# Patient Record
Sex: Male | Born: 1940 | Race: White | Hispanic: No | Marital: Married | Smoking: Former smoker
Health system: Southern US, Community
[De-identification: ages and names within clinical notes are randomized; demographics above are authoritative.]

## PROBLEM LIST (undated history)

## (undated) DIAGNOSIS — F32A Depression, unspecified: Secondary | ICD-10-CM

## (undated) DIAGNOSIS — J45909 Unspecified asthma, uncomplicated: Secondary | ICD-10-CM

## (undated) DIAGNOSIS — I10 Essential (primary) hypertension: Secondary | ICD-10-CM

## (undated) DIAGNOSIS — F329 Major depressive disorder, single episode, unspecified: Secondary | ICD-10-CM

## (undated) DIAGNOSIS — E7800 Pure hypercholesterolemia, unspecified: Secondary | ICD-10-CM

## (undated) DIAGNOSIS — F419 Anxiety disorder, unspecified: Secondary | ICD-10-CM

## (undated) DIAGNOSIS — I509 Heart failure, unspecified: Secondary | ICD-10-CM

## (undated) DIAGNOSIS — F410 Panic disorder [episodic paroxysmal anxiety] without agoraphobia: Secondary | ICD-10-CM

## (undated) HISTORY — PX: HAND SURGERY: SHX662

## (undated) HISTORY — PX: PROSTATE SURGERY: SHX751

## (undated) HISTORY — PX: CARDIAC SURGERY: SHX584

---

## 2012-12-07 ENCOUNTER — Emergency Department (HOSPITAL_COMMUNITY): Payer: PRIVATE HEALTH INSURANCE

## 2012-12-07 ENCOUNTER — Emergency Department (HOSPITAL_COMMUNITY)
Admission: EM | Admit: 2012-12-07 | Discharge: 2012-12-07 | Disposition: A | Discharge status: Home / Self Care | Payer: PRIVATE HEALTH INSURANCE | Attending: Emergency Medicine | Admitting: Emergency Medicine

## 2012-12-07 ENCOUNTER — Encounter (HOSPITAL_COMMUNITY): Payer: Self-pay

## 2012-12-07 DIAGNOSIS — F410 Panic disorder [episodic paroxysmal anxiety] without agoraphobia: Secondary | ICD-10-CM | POA: Insufficient documentation

## 2012-12-07 DIAGNOSIS — F3289 Other specified depressive episodes: Secondary | ICD-10-CM | POA: Insufficient documentation

## 2012-12-07 DIAGNOSIS — F411 Generalized anxiety disorder: Secondary | ICD-10-CM | POA: Insufficient documentation

## 2012-12-07 DIAGNOSIS — R45851 Suicidal ideations: Secondary | ICD-10-CM | POA: Insufficient documentation

## 2012-12-07 DIAGNOSIS — R42 Dizziness and giddiness: Secondary | ICD-10-CM | POA: Insufficient documentation

## 2012-12-07 DIAGNOSIS — Z87891 Personal history of nicotine dependence: Secondary | ICD-10-CM | POA: Insufficient documentation

## 2012-12-07 DIAGNOSIS — Z79899 Other long term (current) drug therapy: Secondary | ICD-10-CM | POA: Insufficient documentation

## 2012-12-07 DIAGNOSIS — J45909 Unspecified asthma, uncomplicated: Secondary | ICD-10-CM | POA: Insufficient documentation

## 2012-12-07 DIAGNOSIS — R110 Nausea: Secondary | ICD-10-CM | POA: Insufficient documentation

## 2012-12-07 DIAGNOSIS — R51 Headache: Secondary | ICD-10-CM | POA: Insufficient documentation

## 2012-12-07 DIAGNOSIS — F329 Major depressive disorder, single episode, unspecified: Secondary | ICD-10-CM | POA: Insufficient documentation

## 2012-12-07 DIAGNOSIS — I10 Essential (primary) hypertension: Secondary | ICD-10-CM | POA: Insufficient documentation

## 2012-12-07 DIAGNOSIS — IMO0002 Reserved for concepts with insufficient information to code with codable children: Secondary | ICD-10-CM | POA: Insufficient documentation

## 2012-12-07 DIAGNOSIS — E7800 Pure hypercholesterolemia, unspecified: Secondary | ICD-10-CM | POA: Insufficient documentation

## 2012-12-07 DIAGNOSIS — I509 Heart failure, unspecified: Secondary | ICD-10-CM | POA: Insufficient documentation

## 2012-12-07 DIAGNOSIS — G479 Sleep disorder, unspecified: Secondary | ICD-10-CM | POA: Insufficient documentation

## 2012-12-07 DIAGNOSIS — F419 Anxiety disorder, unspecified: Secondary | ICD-10-CM

## 2012-12-07 DIAGNOSIS — Z7982 Long term (current) use of aspirin: Secondary | ICD-10-CM | POA: Insufficient documentation

## 2012-12-07 HISTORY — DX: Anxiety disorder, unspecified: F41.9

## 2012-12-07 HISTORY — DX: Unspecified asthma, uncomplicated: J45.909

## 2012-12-07 HISTORY — DX: Heart failure, unspecified: I50.9

## 2012-12-07 HISTORY — DX: Essential (primary) hypertension: I10

## 2012-12-07 HISTORY — DX: Depression, unspecified: F32.A

## 2012-12-07 HISTORY — DX: Panic disorder (episodic paroxysmal anxiety): F41.0

## 2012-12-07 HISTORY — DX: Major depressive disorder, single episode, unspecified: F32.9

## 2012-12-07 HISTORY — DX: Pure hypercholesterolemia, unspecified: E78.00

## 2012-12-07 LAB — CBC WITH DIFFERENTIAL/PLATELET
Eosinophils Absolute: 0.1 10*3/uL (ref 0.0–0.7)
Eosinophils Relative: 1 % (ref 0–5)
HCT: 43.1 % (ref 39.0–52.0)
Lymphs Abs: 2.9 10*3/uL (ref 0.7–4.0)
MCH: 29.2 pg (ref 26.0–34.0)
MCV: 84 fL (ref 78.0–100.0)
Monocytes Absolute: 1.1 10*3/uL — ABNORMAL HIGH (ref 0.1–1.0)
Platelets: 338 10*3/uL (ref 150–400)
RBC: 5.13 MIL/uL (ref 4.22–5.81)

## 2012-12-07 LAB — RAPID URINE DRUG SCREEN, HOSP PERFORMED
Amphetamines: NOT DETECTED
Benzodiazepines: NOT DETECTED
Cocaine: NOT DETECTED
Opiates: NOT DETECTED

## 2012-12-07 LAB — COMPREHENSIVE METABOLIC PANEL
BUN: 24 mg/dL — ABNORMAL HIGH (ref 6–23)
CO2: 19 mEq/L (ref 19–32)
Calcium: 9.4 mg/dL (ref 8.4–10.5)
Creatinine, Ser: 1.08 mg/dL (ref 0.50–1.35)
GFR calc Af Amer: 78 mL/min — ABNORMAL LOW (ref >90)
GFR calc non Af Amer: 68 mL/min — ABNORMAL LOW (ref >90)
Glucose, Bld: 149 mg/dL — ABNORMAL HIGH (ref 70–99)
Sodium: 135 mEq/L (ref 135–145)
Total Protein: 7.7 g/dL (ref 6.0–8.3)

## 2012-12-07 MED ORDER — ONDANSETRON 8 MG PO TBDP
8.0000 mg | ORAL_TABLET | Freq: Once | ORAL | Status: AC
  Administered 2012-12-07: 8 mg via ORAL
  Filled 2012-12-07: qty 1

## 2012-12-07 MED ORDER — LORAZEPAM 1 MG PO TABS: 1.0000 mg | ORAL_TABLET | Freq: Three times a day (TID) | ORAL | Status: AC | PRN

## 2012-12-07 NOTE — ED Provider Notes (Signed)
History     CSN: [id number]  Arrival date & time 12/07/12  1353   First MD Initiated Contact with Patient 12/07/12 1507      Chief Complaint  Patient presents with  . Medical Clearance  . Suicidal  . Agitation  . Headache    (Consider location/radiation/quality/duration/timing/severity/associated sxs/prior treatment) Patient is a 71 y.o. male presenting with headaches. The history is provided by the patient.  Headache   He was started on an pressure and new anxiety medication about one week ago. Since then, he has had intermittent problems with headache in the vertex, intermittent dizziness, intermittent nausea. He has been somewhat more anxious. This morning was the worst episode of anxiety. He told his family that he didn't want to live because he felt so bad he denies actual suicidal thoughts. He denies any visual disturbance. He has had some intermittent crying spells and does have chronic sleep disturbance. He denies anhedonia and denies hallucinations. Family relates that he has chronic problems with anxiety. Review of his record shows that the new blood pressure medicine was lisinopril, and the new anxiety medication was sertraline.  Past Medical History  Diagnosis Date  . Hypertension   . Depression   . Anxiety   . Panic attack   . CHF (congestive heart failure)   . Asthma   . High cholesterol     Past Surgical History  Procedure Date  . Cardiac surgery   . Hand surgery   . Prostate surgery     No family history on file.  History  Substance Use Topics  . Smoking status: Former Games developer  . Smokeless tobacco: Never Used  . Alcohol Use: No      Review of Systems  Neurological: Positive for headaches.  All other systems reviewed and are negative.    Allergies  Contrast media  Home Medications   Current Outpatient Rx  Name  Route  Sig  Dispense  Refill  . ALBUTEROL SULFATE HFA 108 (90 BASE) MCG/ACT IN AERS   Inhalation   Inhale 2 puffs into the  lungs every 6 (six) hours as needed. Shortness of breath         . ASPIRIN 325 MG PO TABS   Oral   Take 650 mg by mouth once.         . ASPIRIN EC 81 MG PO TBEC   Oral   Take 81 mg by mouth daily.         . ATORVASTATIN CALCIUM 10 MG PO TABS   Oral   Take 10 mg by mouth at bedtime.         Marland Kitchen CARVEDILOL 12.5 MG PO TABS   Oral   Take 12.5 mg by mouth 2 (two) times daily with a meal.         . HYDRALAZINE HCL 50 MG PO TABS   Oral   Take 50 mg by mouth 3 (three) times daily.         Marland Kitchen LISINOPRIL 10 MG PO TABS   Oral   Take 10 mg by mouth daily.         Marland Kitchen MONTELUKAST SODIUM 10 MG PO TABS   Oral   Take 10 mg by mouth at bedtime.         Marland Kitchen NIACIN ER (ANTIHYPERLIPIDEMIC) 500 MG PO TBCR   Oral   Take 500 mg by mouth at bedtime.         Marland Kitchen RANITIDINE HCL 150 MG PO TABS  Oral   Take 150 mg by mouth 2 (two) times daily.         . SERTRALINE HCL 50 MG PO TABS   Oral   Take 50 mg by mouth daily.           BP 188/89  Pulse 86  Temp 98.4 F (36.9 C) (Oral)  Resp 18  SpO2 96%  Physical Exam  Nursing note and vitals reviewed. 71 year old male, resting comfortably and in no acute distress. Vital signs are significant for hypertension with blood pressure 188/89. Oxygen saturation is 96%, which is normal. Head is normocephalic and atraumatic. PERRLA, EOMI. Oropharynx is clear. Fundi show no hemorrhage, exudate, or papilledema. Neck is nontender and supple without adenopathy or JVD. No carotid bruits. Back is nontender and there is no CVA tenderness. Lungs are clear without rales, wheezes, or rhonchi. Chest is nontender. Heart has regular rate and rhythm without murmur. Abdomen is soft, flat, nontender without masses or hepatosplenomegaly and peristalsis is normoactive. Extremities have no cyanosis or edema, full range of motion is present. Skin is warm and dry without rash. Neurologic: Mental status is normal, cranial nerves are intact, there are no  motor or sensory deficits. Dizziness is not reproduced by head movement.   ED Course  Procedures (including critical care time)  Results for orders placed during the hospital encounter of 12/07/12  CBC WITH DIFFERENTIAL      Component Value Range   WBC 11.1 (*) 4.0 - 10.5 K/uL   RBC 5.13  4.22 - 5.81 MIL/uL   Hemoglobin 15.0  13.0 - 17.0 g/dL   HCT 40.9  81.1 - 91.4 %   MCV 84.0  78.0 - 100.0 fL   MCH 29.2  26.0 - 34.0 pg   MCHC 34.8  30.0 - 36.0 g/dL   RDW 78.2  95.6 - 21.3 %   Platelets 338  150 - 400 K/uL   Neutrophils Relative 63  43 - 77 %   Neutro Abs 7.0  1.7 - 7.7 K/uL   Lymphocytes Relative 26  12 - 46 %   Lymphs Abs 2.9  0.7 - 4.0 K/uL   Monocytes Relative 10  3 - 12 %   Monocytes Absolute 1.1 (*) 0.1 - 1.0 K/uL   Eosinophils Relative 1  0 - 5 %   Eosinophils Absolute 0.1  0.0 - 0.7 K/uL   Basophils Relative 1  0 - 1 %   Basophils Absolute 0.1  0.0 - 0.1 K/uL  COMPREHENSIVE METABOLIC PANEL      Component Value Range   Sodium 135  135 - 145 mEq/L   Potassium 3.8  3.5 - 5.1 mEq/L   Chloride 104  96 - 112 mEq/L   CO2 19  19 - 32 mEq/L   Glucose, Bld 149 (*) 70 - 99 mg/dL   BUN 24 (*) 6 - 23 mg/dL   Creatinine, Ser 0.86  0.50 - 1.35 mg/dL   Calcium 9.4  8.4 - 57.8 mg/dL   Total Protein 7.7  6.0 - 8.3 g/dL   Albumin 4.1  3.5 - 5.2 g/dL   AST 18  0 - 37 U/L   ALT 20  0 - 53 U/L   Alkaline Phosphatase 62  39 - 117 U/L   Total Bilirubin 0.5  0.3 - 1.2 mg/dL   GFR calc non Af Amer 68 (*) >90 mL/min   GFR calc Af Amer 78 (*) >90 mL/min  URINE RAPID DRUG SCREEN (HOSP  PERFORMED)      Component Value Range   Opiates NONE DETECTED  NONE DETECTED   Cocaine NONE DETECTED  NONE DETECTED   Benzodiazepines NONE DETECTED  NONE DETECTED   Amphetamines NONE DETECTED  NONE DETECTED   Tetrahydrocannabinol NONE DETECTED  NONE DETECTED   Barbiturates NONE DETECTED  NONE DETECTED  ETHANOL      Component Value Range   Alcohol, Ethyl (B) <11  0 - 11 mg/dL   Ct Head Wo  Contrast  12/07/2012  *RADIOLOGY REPORT*  Clinical Data: 71 year old male who is agitated.  Headache.  CT HEAD WITHOUT CONTRAST  Technique:  Contiguous axial images were obtained from the base of the skull through the vertex without contrast.  Comparison: None.  Findings: Left maxillary sinus mucosal thickening, bubbly opacity. Minor ethmoid air cell mucosal thickening.  Other Visualized paranasal sinuses and mastoids are clear.  No acute osseous abnormality identified.  Visualized orbits and scalp soft tissues are within normal limits.  Mild Calcified atherosclerosis at the skull base.  Mild for age scattered cerebral white matter hypodensity. Cerebral volume is within normal limits for age.  No ventriculomegaly. No midline shift, mass effect, or evidence of mass lesion.  No acute intracranial hemorrhage identified.  No evidence of cortically based acute infarction identified.  No suspicious intracranial vascular hyperdensity.  IMPRESSION: 1. No acute intracranial abnormality.  Mild for age nonspecific white matter changes. 2.  Mild left maxillary sinusitis.   Original Report Authenticated By: Erskine Speed, M.D.       1. Anxiety   2. Hypertension       MDM  Worsening of anxiety which may be medication related. He does not have a condition which would require acute psychiatric intervention. Head CT will be obtained and I anticipate taking him off of the sertraline and treating his anxiety with lorazepam as necessary.        Dione Booze, MD 12/08/12 [phone]

## 2012-12-07 NOTE — ED Notes (Addendum)
Patient was started on new psych medications and blood pressure medicine a week ago and has been feeling agitated, having a headache, and having suicidal thoughts.  Patient denies having a plan for SI/HI  Patient's daughter reports that the patient told his PCP that he was depressed and had anxiety. Patient at times denies SI , but Dr. Shary Decamp reported that he was hugging his family good-bye at the office.

## 2013-06-11 IMAGING — CT CT HEAD W/O CM
2 series · 15 of 30 positions shown, 19 images · non-contrast
Comparison: None.

CLINICAL DATA: 71-year-old male who is agitated.  Headache.

CT HEAD WITHOUT CONTRAST
TECHNIQUE: Contiguous axial images were obtained from the base of
the skull through the vertex without contrast.

[Series 2: head w/o · axial · non-contrast · 0.43mm/px · z∈[+1416,+1536]mm · 13 of 30 slices shown, 17 images]
[im 3/30  brain]
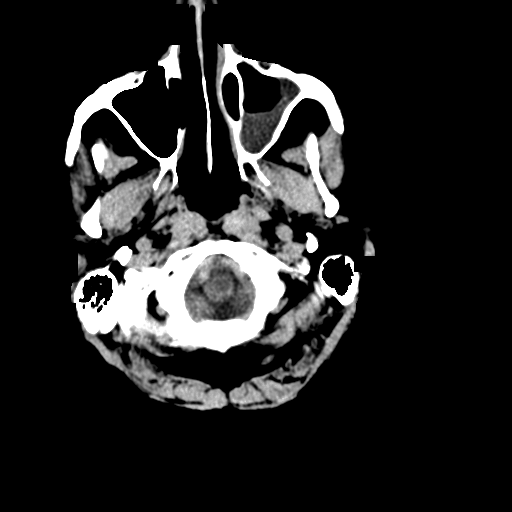
[im 3/30  bone]
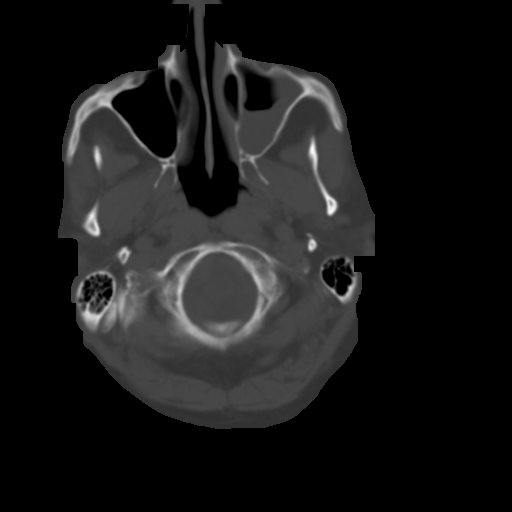
[im 5/30  brain]
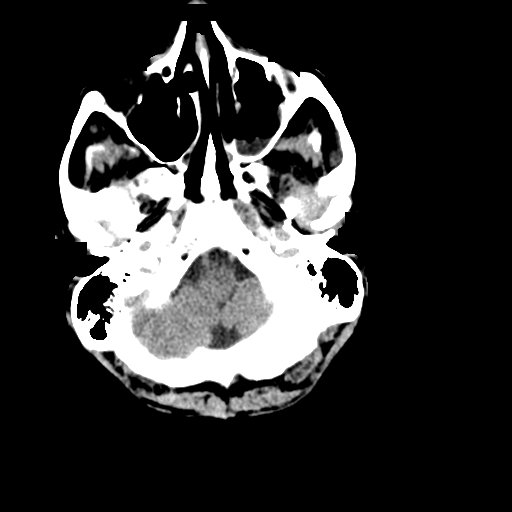
[im 7/30  brain]
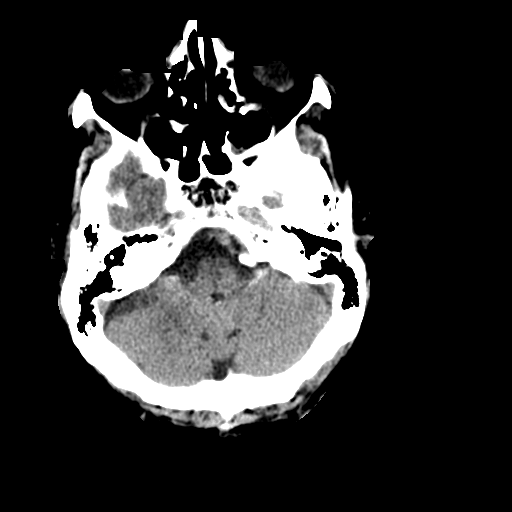
[im 9/30  brain]
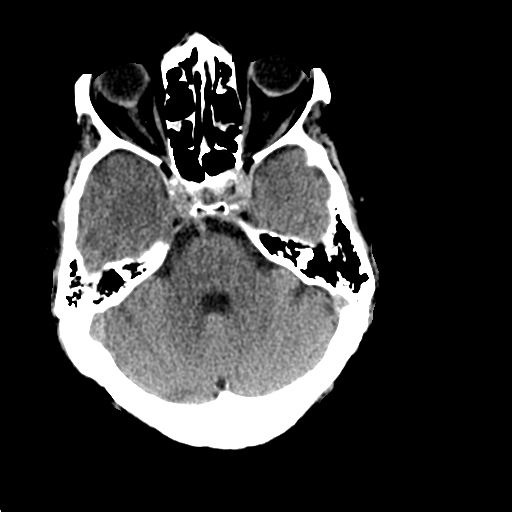
[im 11/30  brain]
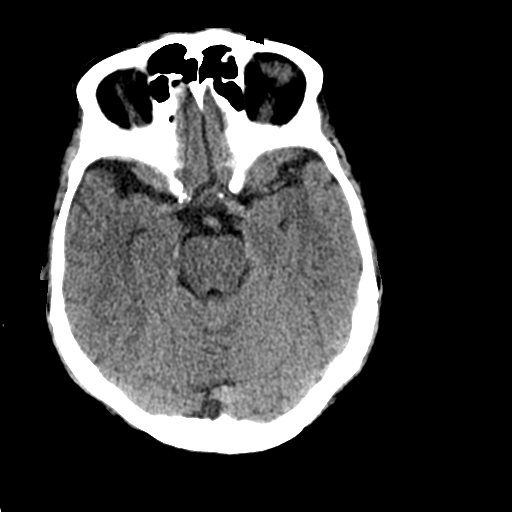
[im 11/30  bone]
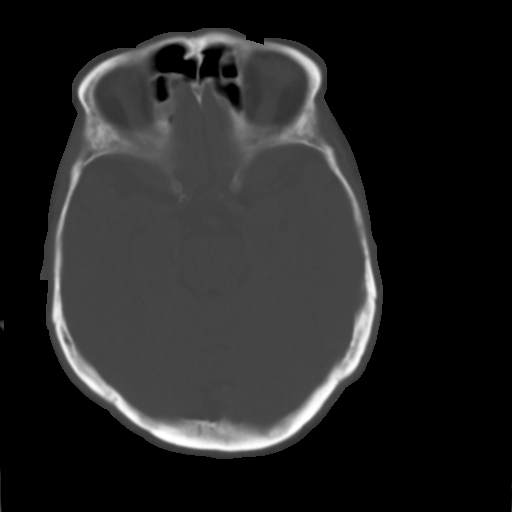
[im 13/30  brain]
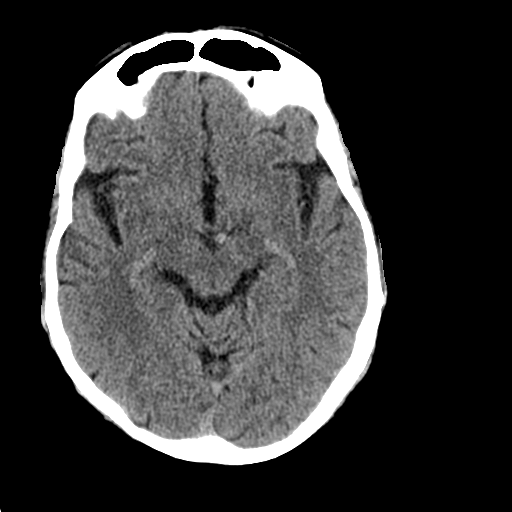
[im 15/30  brain]
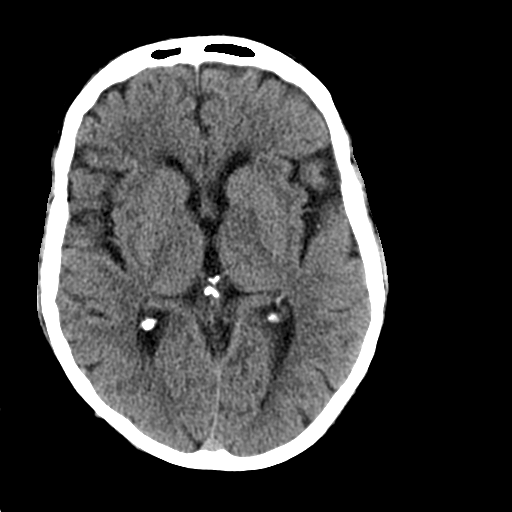
[im 17/30  brain]
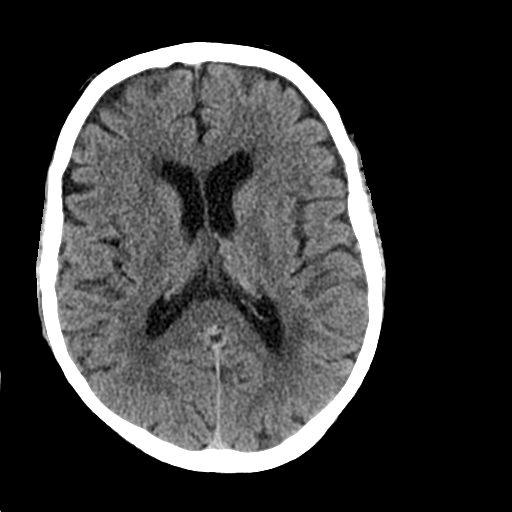
[im 19/30  brain]
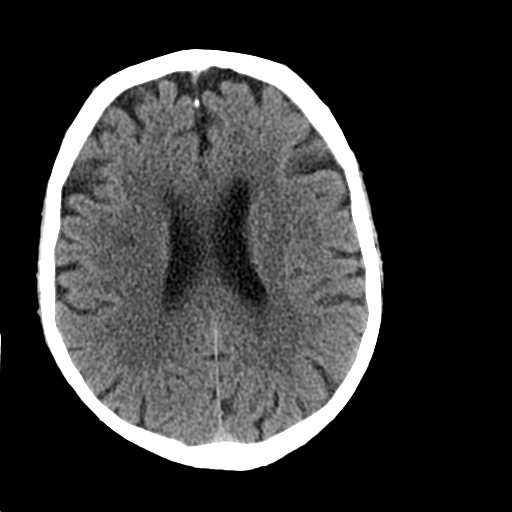
[im 19/30  bone]
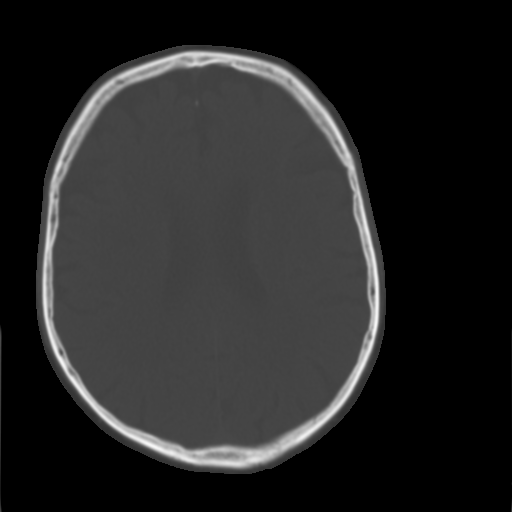
[im 21/30  brain]
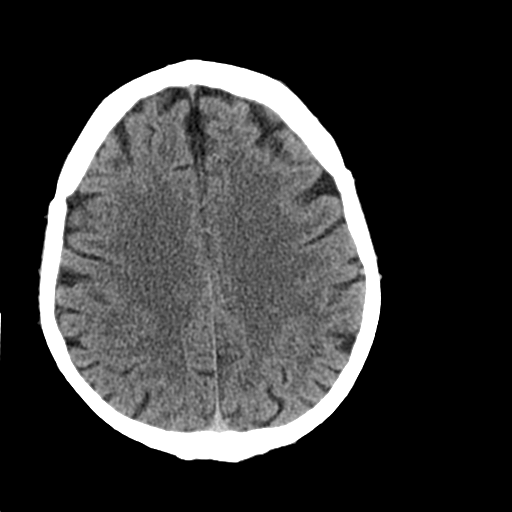
[im 23/30  brain]
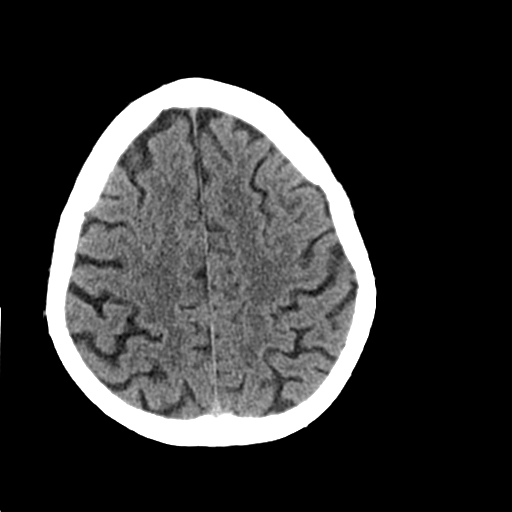
[im 25/30  brain]
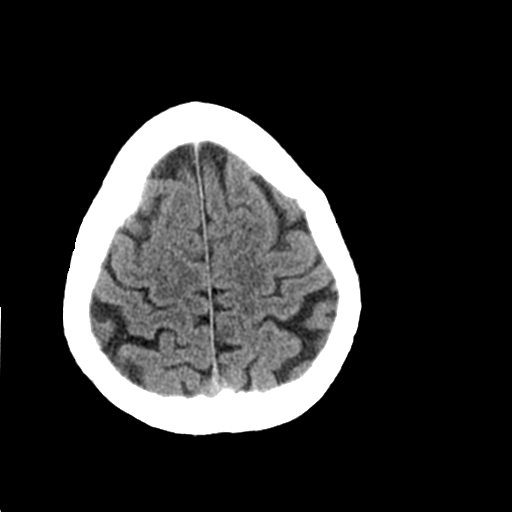
[im 27/30  brain]
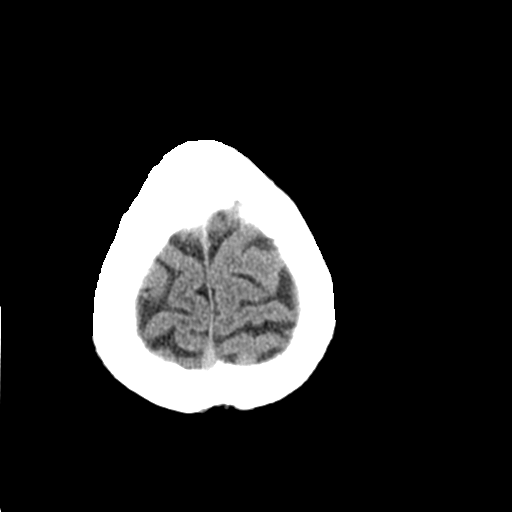
[im 27/30  bone]
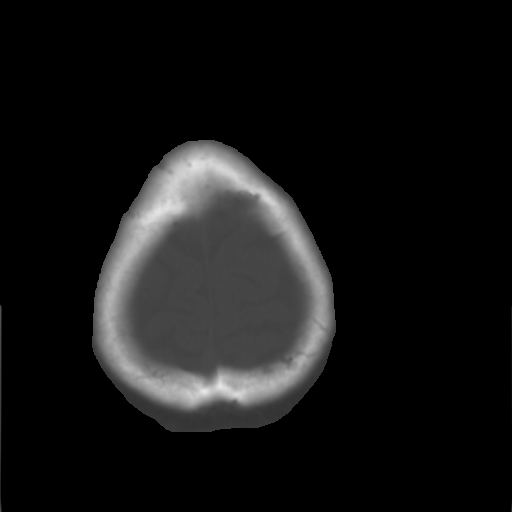

[Series 3: bone windows · axial · 0.43mm/px · z∈[+1416,+1436]mm · 2 of 30 slices shown]
[im 3/30  bone]
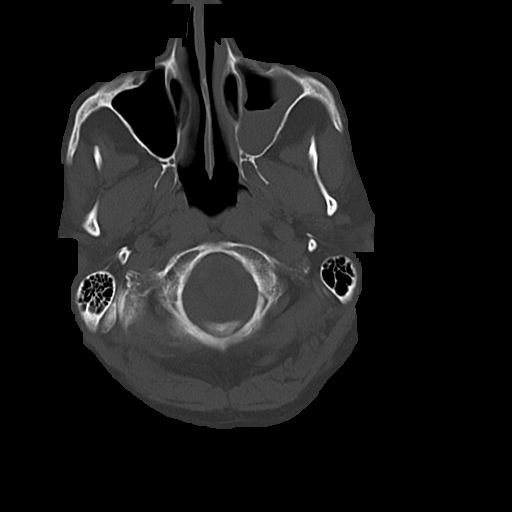
[im 7/30  bone]
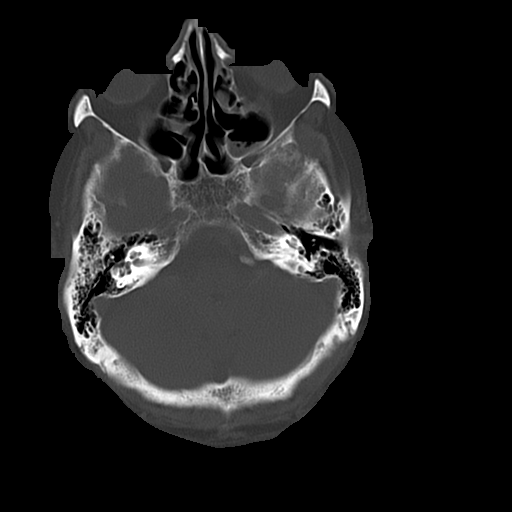

[15 of 30 positions shown; findings below may reference images not displayed]

FINDINGS: Left maxillary sinus mucosal thickening, bubbly opacity.
Minor ethmoid air cell mucosal thickening.  Other Visualized
paranasal sinuses and mastoids are clear.  No acute osseous
abnormality identified.  Visualized orbits and scalp soft tissues
are within normal limits.  Mild Calcified atherosclerosis at the
skull base.

Mild for age scattered cerebral white matter hypodensity. Cerebral
volume is within normal limits for age.  No ventriculomegaly. No
midline shift, mass effect, or evidence of mass lesion.  No acute
intracranial hemorrhage identified.  No evidence of cortically
based acute infarction identified.  No suspicious intracranial
vascular hyperdensity.
IMPRESSION: 1. No acute intracranial abnormality.  Mild for age nonspecific
white matter changes.
2.  Mild left maxillary sinusitis.

## 2015-06-27 ENCOUNTER — Other Ambulatory Visit: Payer: Self-pay | Admitting: *Deleted

## 2015-06-27 DIAGNOSIS — I739 Peripheral vascular disease, unspecified: Secondary | ICD-10-CM

## 2015-07-26 ENCOUNTER — Encounter (HOSPITAL_COMMUNITY): Payer: PRIVATE HEALTH INSURANCE

## 2015-07-26 ENCOUNTER — Encounter: Payer: PRIVATE HEALTH INSURANCE | Admitting: Vascular Surgery

## 2015-08-21 ENCOUNTER — Telehealth: Payer: Self-pay | Admitting: Vascular Surgery

## 2015-08-21 NOTE — Telephone Encounter (Signed)
-----   Message from Phillips Odor, RN sent at 08/21/2015  2:43 PM EDT ----- Regarding: needs appt. rescheduled from 9/8 to future Contact: [phone] Pt's daughter, Tobi Bastos, called to report pt. 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  The daughter stated he may not tolerate the ABI due to his pain and sensitivity, in the left ankle.  Please call her to reschedule his appts. for ABI and Dr. Marty Heck; contact Tobi Bastos @ [phone].  She is his POA.  She stated he is very forgetful.  Thanks.

## 2015-08-21 NOTE — Telephone Encounter (Signed)
Spoke with Tobi Bastos to r.s to 09/13/2015, dpm

## 2015-08-23 ENCOUNTER — Encounter: Payer: PRIVATE HEALTH INSURANCE | Admitting: Vascular Surgery

## 2015-08-23 ENCOUNTER — Encounter (HOSPITAL_COMMUNITY): Payer: PRIVATE HEALTH INSURANCE

## 2015-09-11 ENCOUNTER — Encounter: Payer: Self-pay | Admitting: Vascular Surgery

## 2015-09-13 ENCOUNTER — Ambulatory Visit (HOSPITAL_COMMUNITY)
Admission: RE | Admit: 2015-09-13 | Discharge: 2015-09-13 | Disposition: A | Discharge status: Home / Self Care | Payer: Medicare Other | Source: Ambulatory Visit | Attending: Vascular Surgery | Admitting: Vascular Surgery

## 2015-09-13 ENCOUNTER — Ambulatory Visit (INDEPENDENT_AMBULATORY_CARE_PROVIDER_SITE_OTHER): Payer: Medicare Other | Admitting: Vascular Surgery

## 2015-09-13 ENCOUNTER — Encounter: Payer: Self-pay | Admitting: Vascular Surgery

## 2015-09-13 VITALS — BP 149/84 | HR 52 | Temp 97.9°F | Resp 14 | Ht 66.0 in | Wt 195.0 lb

## 2015-09-13 DIAGNOSIS — I739 Peripheral vascular disease, unspecified: Secondary | ICD-10-CM

## 2015-09-13 DIAGNOSIS — I10 Essential (primary) hypertension: Secondary | ICD-10-CM | POA: Insufficient documentation

## 2015-09-13 DIAGNOSIS — E780 Pure hypercholesterolemia: Secondary | ICD-10-CM | POA: Insufficient documentation

## 2015-09-13 NOTE — Progress Notes (Signed)
[facility] [place] HISTORY AND PHYSICAL   History of Present Illness:  Patient is a 74 y.o. year old male who presents for evaluation of bilateral leg pain.  He develops pain in both legs after walking approximately one block. This is been slowly progressive over the last 6 months to one year. He states that if he stops walking the pain is relieved after 4-5 minutes. He did recently have some type of I&D of a left ankle infection in [facility] approximate 3-4 weeks ago. He states the ankle is still sore but is improving. He denies rest pain in the feet. He states he did have an arterial study in his leg several years ago which was normal. However, he states that it was not completely normal and there was suspicion that he might have small vessel disease in his feet. He denies any nonhealing wounds. Other medical problems include hypertension, coronary disease, elevated cholesterol, asthma, congestive heart failure. All of which are currently stable. He is on aspirin and ACE inhibitor and a statin. 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  Past Medical History  Diagnosis Date  . Hypertension   . Depression   . Anxiety   . Panic attack   . CHF (congestive heart failure)   . Asthma   . High cholesterol     Past Surgical History  Procedure Laterality Date  . Cardiac surgery    . Hand surgery    . Prostate surgery      Social History Social History  Substance Use Topics  . Smoking status: Former Games developer  . Smokeless tobacco: Never Used  . Alcohol Use: No    Family History No family history on file.  Allergies  Allergies  Allergen Reactions  . Contrast Media [Iodinated Diagnostic Agents] Other (See Comments)    Acute kidney failure      Current Outpatient Prescriptions  Medication Sig Dispense Refill  . ALPRAZolam (XANAX) 1 MG tablet Take 1  mg by mouth at bedtime as needed for anxiety.    Marland Kitchen aspirin EC 81 MG tablet Take 81 mg by mouth daily.    Marland Kitchen atorvastatin (LIPITOR) 10 MG tablet Take 10 mg by mouth at bedtime.    . carvedilol (COREG) 12.5 MG tablet Take 25 mg by mouth 2 (two) times daily with a meal.     . hydrALAZINE (APRESOLINE) 50 MG tablet Take 50 mg by mouth 2 (two) times daily.     . hydrochlorothiazide (MICROZIDE) 12.5 MG capsule Take 12.5 mg by mouth daily.    . ranitidine (ZANTAC) 150 MG tablet Take 150 mg by mouth 2 (two) times daily.    Marland Kitchen albuterol (PROVENTIL HFA;VENTOLIN HFA) 108 (90 BASE) MCG/ACT inhaler Inhale 2 puffs into the lungs every 6 (six) hours as needed. Shortness of breath    . lisinopril (PRINIVIL,ZESTRIL) 10 MG tablet Take 10 mg by mouth daily.    Marland Kitchen LORazepam (ATIVAN) 1 MG tablet Take 1 tablet (1 mg total) by mouth 3 (three) times daily as needed for anxiety. (Patient not taking: Reported on 09/13/2015) 30 tablet 0  . montelukast (SINGULAIR) 10 MG tablet Take 10 mg by mouth at bedtime.    . niacin (NIASPAN) 500 MG CR tablet Take 500 mg by mouth at bedtime.    . [DISCONTINUED] sertraline (ZOLOFT) 50 MG tablet Take 50 mg by mouth daily.     No  current facility-administered medications for this visit.    ROS:   General:  No weight loss, Fever, chills  HEENT: No recent headaches, no nasal bleeding, no visual changes, no sore throat  Neurologic: No dizziness, blackouts, seizures. No recent symptoms of stroke or mini- stroke. No recent episodes of slurred speech, or temporary blindness.  Cardiac: No recent episodes of chest pain/pressure, no shortness of breath at rest.  + shortness of breath with exertion.  Denies history of atrial fibrillation or irregular heartbeat  Vascular: No history of rest pain in feet.  No history of claudication.  No history of non-healing ulcer, No history of DVT   Pulmonary: No home oxygen, no productive cough, no hemoptysis,  No asthma or wheezing  Musculoskeletal:    Arthritis,  Low back pain,  [x ] Joint pain  Hematologic:No history of hypercoagulable state.  No history of easy bleeding.  No history of anemia  Gastrointestinal: No hematochezia or melena,  No gastroesophageal reflux, no trouble swallowing  Urinary: [x ] chronic Kidney disease,  on HD -  MWF or  TTHS,  Burning with urination,  Frequent urination,  Difficulty urinating;   Skin: No rashes  Psychological: + history of anxiety,  + history of depression   Physical Examination  Filed Vitals:   09/13/15 0957 09/13/15 1000  BP: 156/89 149/84  Pulse: 56 52  Temp: 97.9 F (36.6 C)   TempSrc: Oral   Resp: 14   Height:  (1.676 m)   Weight: 195 lb (88.451 kg)   SpO2: 99%     Body mass index is 31.49 kg/(m^2).  General:  Alert and oriented, no acute distress HEENT: Normal Neck: No bruit or JVD Pulmonary: Clear to auscultation bilaterally Cardiac: Regular Rate and Rhythm without murmur Abdomen: Soft, non-tender, non-distended, no mass, slightly obese Skin: No rash Extremity Pulses:  2+ radial, brachial, 2+ right femoral, 2+ right dorsalis pedis, absent left femoral popliteal and pedal pulses, feet are pink and warm bilaterally Musculoskeletal: No deformity or edema  Neurologic: Upper and lower extremity motor 5/5 and symmetric  DATA:  Patient had bilateral ABIs performed today which were 1.0 on the right 0.9 on the left triphasic waveforms on the right biphasic on the left   ASSESSMENT:  Evidence of peripheral arterial disease in the left lower extremity; however, the patient has normal ABIs and a palpable pulse on the right side. His left leg symptoms probably are attributable to a component of peripheral arterial disease. However this does not really explain his right leg symptoms where he has a completely normal arterial exam. The patient is not a very good candidate for an arteriogram due to previous problems with renal failure after contrast exposure. He  is currently not at risk of limb loss and has adequate perfusion for wound healing in his left lower extremity. I believe the best option at this point would be a conservative walking program of 30 minutes of walking daily to try to improve his walking distance. I discussed with the patient and his daughter at length today risk versus benefit of an intervention for his arterial disease versus benefit. If the patient continues to have worsening of symptoms or is more bothered by this or has no improvement with his walking program we could revisit whether or not to do an arteriogram although this would carry some risk.   PLAN:  The patient will have follow-up ABIs and arterial duplex scan in 6  months. He will walk 30 minutes daily.  Fabienne Bruns, MD [facility] [place] Office: [phone] Pager: [phone]

## 2015-09-13 NOTE — Addendum Note (Signed)
Addended by: Adria Dill L on: 09/13/2015 11:18 AM   Modules accepted: Orders

## 2016-03-13 ENCOUNTER — Encounter (HOSPITAL_COMMUNITY): Payer: Medicare Other

## 2016-03-13 ENCOUNTER — Ambulatory Visit: Payer: Medicare Other | Admitting: Family

## 2016-03-20 ENCOUNTER — Ambulatory Visit: Payer: Medicare Other | Admitting: Family

## 2016-03-20 ENCOUNTER — Encounter (HOSPITAL_COMMUNITY): Payer: Medicare Other

## 2016-03-24 DIAGNOSIS — E119 Type 2 diabetes mellitus without complications: Secondary | ICD-10-CM | POA: Diagnosis not present

## 2016-03-24 DIAGNOSIS — I10 Essential (primary) hypertension: Secondary | ICD-10-CM | POA: Diagnosis not present

## 2016-03-24 DIAGNOSIS — J209 Acute bronchitis, unspecified: Secondary | ICD-10-CM | POA: Diagnosis not present

## 2016-04-07 DIAGNOSIS — M179 Osteoarthritis of knee, unspecified: Secondary | ICD-10-CM | POA: Diagnosis not present

## 2016-04-07 DIAGNOSIS — M25561 Pain in right knee: Secondary | ICD-10-CM | POA: Diagnosis not present

## 2016-04-07 DIAGNOSIS — E119 Type 2 diabetes mellitus without complications: Secondary | ICD-10-CM | POA: Diagnosis not present

## 2016-04-16 DIAGNOSIS — M25569 Pain in unspecified knee: Secondary | ICD-10-CM | POA: Diagnosis not present

## 2016-04-23 DIAGNOSIS — N183 Chronic kidney disease, stage 3 (moderate): Secondary | ICD-10-CM | POA: Diagnosis not present

## 2016-04-23 DIAGNOSIS — M7989 Other specified soft tissue disorders: Secondary | ICD-10-CM | POA: Diagnosis not present

## 2016-04-23 DIAGNOSIS — E119 Type 2 diabetes mellitus without complications: Secondary | ICD-10-CM | POA: Diagnosis not present

## 2016-04-23 DIAGNOSIS — M25421 Effusion, right elbow: Secondary | ICD-10-CM | POA: Diagnosis not present

## 2016-04-25 DIAGNOSIS — M109 Gout, unspecified: Secondary | ICD-10-CM | POA: Diagnosis not present

## 2016-04-25 DIAGNOSIS — N183 Chronic kidney disease, stage 3 (moderate): Secondary | ICD-10-CM | POA: Diagnosis not present

## 2016-04-25 DIAGNOSIS — I739 Peripheral vascular disease, unspecified: Secondary | ICD-10-CM | POA: Diagnosis not present

## 2016-04-25 DIAGNOSIS — E119 Type 2 diabetes mellitus without complications: Secondary | ICD-10-CM | POA: Diagnosis not present

## 2016-04-25 DIAGNOSIS — M7021 Olecranon bursitis, right elbow: Secondary | ICD-10-CM | POA: Diagnosis not present

## 2016-12-03 DIAGNOSIS — Z79899 Other long term (current) drug therapy: Secondary | ICD-10-CM | POA: Diagnosis not present

## 2016-12-03 DIAGNOSIS — R05 Cough: Secondary | ICD-10-CM | POA: Diagnosis not present

## 2016-12-03 DIAGNOSIS — Z23 Encounter for immunization: Secondary | ICD-10-CM | POA: Diagnosis not present

## 2016-12-03 DIAGNOSIS — K219 Gastro-esophageal reflux disease without esophagitis: Secondary | ICD-10-CM | POA: Diagnosis not present

## 2016-12-03 DIAGNOSIS — I129 Hypertensive chronic kidney disease with stage 1 through stage 4 chronic kidney disease, or unspecified chronic kidney disease: Secondary | ICD-10-CM | POA: Diagnosis not present

## 2016-12-03 DIAGNOSIS — E782 Mixed hyperlipidemia: Secondary | ICD-10-CM | POA: Diagnosis not present

## 2016-12-03 DIAGNOSIS — N183 Chronic kidney disease, stage 3 (moderate): Secondary | ICD-10-CM | POA: Diagnosis not present

## 2016-12-03 DIAGNOSIS — I2510 Atherosclerotic heart disease of native coronary artery without angina pectoris: Secondary | ICD-10-CM | POA: Diagnosis not present

## 2016-12-03 DIAGNOSIS — I739 Peripheral vascular disease, unspecified: Secondary | ICD-10-CM | POA: Diagnosis not present

## 2016-12-03 DIAGNOSIS — I10 Essential (primary) hypertension: Secondary | ICD-10-CM | POA: Diagnosis not present

## 2016-12-03 DIAGNOSIS — E119 Type 2 diabetes mellitus without complications: Secondary | ICD-10-CM | POA: Diagnosis not present

## 2017-01-29 DIAGNOSIS — M25461 Effusion, right knee: Secondary | ICD-10-CM | POA: Diagnosis not present

## 2017-01-29 DIAGNOSIS — M1711 Unilateral primary osteoarthritis, right knee: Secondary | ICD-10-CM | POA: Diagnosis not present

## 2017-01-29 DIAGNOSIS — G4733 Obstructive sleep apnea (adult) (pediatric): Secondary | ICD-10-CM | POA: Diagnosis not present

## 2017-01-29 DIAGNOSIS — M25561 Pain in right knee: Secondary | ICD-10-CM | POA: Diagnosis not present

## 2017-02-06 DIAGNOSIS — J4520 Mild intermittent asthma, uncomplicated: Secondary | ICD-10-CM | POA: Diagnosis not present

## 2017-02-06 DIAGNOSIS — G4733 Obstructive sleep apnea (adult) (pediatric): Secondary | ICD-10-CM | POA: Diagnosis not present

## 2017-02-06 DIAGNOSIS — R5383 Other fatigue: Secondary | ICD-10-CM | POA: Diagnosis not present

## 2017-02-06 DIAGNOSIS — M1711 Unilateral primary osteoarthritis, right knee: Secondary | ICD-10-CM | POA: Diagnosis not present

## 2017-02-24 DIAGNOSIS — H25043 Posterior subcapsular polar age-related cataract, bilateral: Secondary | ICD-10-CM | POA: Diagnosis not present

## 2017-02-24 DIAGNOSIS — H2513 Age-related nuclear cataract, bilateral: Secondary | ICD-10-CM | POA: Diagnosis not present

## 2017-02-24 DIAGNOSIS — H5203 Hypermetropia, bilateral: Secondary | ICD-10-CM | POA: Diagnosis not present

## 2017-03-03 DIAGNOSIS — M2550 Pain in unspecified joint: Secondary | ICD-10-CM | POA: Diagnosis not present

## 2017-03-03 DIAGNOSIS — M25572 Pain in left ankle and joints of left foot: Secondary | ICD-10-CM | POA: Diagnosis not present

## 2017-03-10 DIAGNOSIS — N183 Chronic kidney disease, stage 3 (moderate): Secondary | ICD-10-CM | POA: Diagnosis not present

## 2017-03-10 DIAGNOSIS — I129 Hypertensive chronic kidney disease with stage 1 through stage 4 chronic kidney disease, or unspecified chronic kidney disease: Secondary | ICD-10-CM | POA: Diagnosis not present

## 2017-03-10 DIAGNOSIS — M109 Gout, unspecified: Secondary | ICD-10-CM | POA: Diagnosis not present

## 2017-03-10 DIAGNOSIS — E119 Type 2 diabetes mellitus without complications: Secondary | ICD-10-CM | POA: Diagnosis not present

## 2017-03-24 DIAGNOSIS — H2513 Age-related nuclear cataract, bilateral: Secondary | ICD-10-CM | POA: Diagnosis not present

## 2017-03-24 DIAGNOSIS — H25013 Cortical age-related cataract, bilateral: Secondary | ICD-10-CM | POA: Diagnosis not present

## 2017-03-24 DIAGNOSIS — H40033 Anatomical narrow angle, bilateral: Secondary | ICD-10-CM | POA: Diagnosis not present

## 2017-03-24 DIAGNOSIS — H2512 Age-related nuclear cataract, left eye: Secondary | ICD-10-CM | POA: Diagnosis not present

## 2017-03-24 DIAGNOSIS — H40032 Anatomical narrow angle, left eye: Secondary | ICD-10-CM | POA: Diagnosis not present

## 2017-03-24 DIAGNOSIS — H18413 Arcus senilis, bilateral: Secondary | ICD-10-CM | POA: Diagnosis not present

## 2017-03-24 DIAGNOSIS — H25043 Posterior subcapsular polar age-related cataract, bilateral: Secondary | ICD-10-CM | POA: Diagnosis not present

## 2017-03-30 DIAGNOSIS — N183 Chronic kidney disease, stage 3 (moderate): Secondary | ICD-10-CM | POA: Diagnosis not present

## 2017-03-30 DIAGNOSIS — M1A9XX0 Chronic gout, unspecified, without tophus (tophi): Secondary | ICD-10-CM | POA: Diagnosis not present

## 2017-03-31 DIAGNOSIS — H40033 Anatomical narrow angle, bilateral: Secondary | ICD-10-CM | POA: Diagnosis not present

## 2017-04-03 DIAGNOSIS — Z79899 Other long term (current) drug therapy: Secondary | ICD-10-CM | POA: Diagnosis not present

## 2017-04-03 DIAGNOSIS — N183 Chronic kidney disease, stage 3 (moderate): Secondary | ICD-10-CM | POA: Diagnosis not present

## 2017-04-03 DIAGNOSIS — I129 Hypertensive chronic kidney disease with stage 1 through stage 4 chronic kidney disease, or unspecified chronic kidney disease: Secondary | ICD-10-CM | POA: Diagnosis not present

## 2017-04-03 DIAGNOSIS — R079 Chest pain, unspecified: Secondary | ICD-10-CM | POA: Diagnosis not present

## 2017-04-03 DIAGNOSIS — E782 Mixed hyperlipidemia: Secondary | ICD-10-CM | POA: Diagnosis not present

## 2017-04-03 DIAGNOSIS — R51 Headache: Secondary | ICD-10-CM | POA: Diagnosis not present

## 2017-04-03 DIAGNOSIS — R5381 Other malaise: Secondary | ICD-10-CM | POA: Diagnosis not present

## 2017-04-03 DIAGNOSIS — R5383 Other fatigue: Secondary | ICD-10-CM | POA: Diagnosis not present

## 2017-04-03 DIAGNOSIS — E1122 Type 2 diabetes mellitus with diabetic chronic kidney disease: Secondary | ICD-10-CM | POA: Diagnosis not present

## 2017-04-03 DIAGNOSIS — I2510 Atherosclerotic heart disease of native coronary artery without angina pectoris: Secondary | ICD-10-CM | POA: Diagnosis not present

## 2017-04-05 DIAGNOSIS — G4733 Obstructive sleep apnea (adult) (pediatric): Secondary | ICD-10-CM | POA: Diagnosis not present

## 2017-04-07 DIAGNOSIS — H40031 Anatomical narrow angle, right eye: Secondary | ICD-10-CM | POA: Diagnosis not present

## 2017-04-08 DIAGNOSIS — E1122 Type 2 diabetes mellitus with diabetic chronic kidney disease: Secondary | ICD-10-CM | POA: Diagnosis not present

## 2017-04-08 DIAGNOSIS — R945 Abnormal results of liver function studies: Secondary | ICD-10-CM | POA: Diagnosis not present

## 2017-04-08 DIAGNOSIS — I2510 Atherosclerotic heart disease of native coronary artery without angina pectoris: Secondary | ICD-10-CM | POA: Diagnosis not present

## 2017-04-08 DIAGNOSIS — R5383 Other fatigue: Secondary | ICD-10-CM | POA: Diagnosis not present

## 2017-04-08 DIAGNOSIS — N183 Chronic kidney disease, stage 3 (moderate): Secondary | ICD-10-CM | POA: Diagnosis not present

## 2017-04-08 DIAGNOSIS — D649 Anemia, unspecified: Secondary | ICD-10-CM | POA: Diagnosis not present

## 2017-04-08 DIAGNOSIS — Z79899 Other long term (current) drug therapy: Secondary | ICD-10-CM | POA: Diagnosis not present

## 2017-04-08 DIAGNOSIS — M109 Gout, unspecified: Secondary | ICD-10-CM | POA: Diagnosis not present

## 2017-04-08 DIAGNOSIS — R5381 Other malaise: Secondary | ICD-10-CM | POA: Diagnosis not present

## 2017-04-08 DIAGNOSIS — I129 Hypertensive chronic kidney disease with stage 1 through stage 4 chronic kidney disease, or unspecified chronic kidney disease: Secondary | ICD-10-CM | POA: Diagnosis not present

## 2017-04-09 DIAGNOSIS — I447 Left bundle-branch block, unspecified: Secondary | ICD-10-CM | POA: Diagnosis not present

## 2017-04-14 DIAGNOSIS — H40033 Anatomical narrow angle, bilateral: Secondary | ICD-10-CM | POA: Diagnosis not present

## 2017-04-20 DIAGNOSIS — J4540 Moderate persistent asthma, uncomplicated: Secondary | ICD-10-CM | POA: Diagnosis not present

## 2017-04-20 DIAGNOSIS — G4733 Obstructive sleep apnea (adult) (pediatric): Secondary | ICD-10-CM | POA: Diagnosis not present

## 2017-04-20 DIAGNOSIS — R5383 Other fatigue: Secondary | ICD-10-CM | POA: Diagnosis not present

## 2017-04-22 DIAGNOSIS — I10 Essential (primary) hypertension: Secondary | ICD-10-CM | POA: Diagnosis not present

## 2017-04-22 DIAGNOSIS — I70213 Atherosclerosis of native arteries of extremities with intermittent claudication, bilateral legs: Secondary | ICD-10-CM | POA: Diagnosis not present

## 2017-04-22 DIAGNOSIS — E7800 Pure hypercholesterolemia, unspecified: Secondary | ICD-10-CM | POA: Diagnosis not present

## 2017-04-22 DIAGNOSIS — I739 Peripheral vascular disease, unspecified: Secondary | ICD-10-CM | POA: Diagnosis not present

## 2017-04-22 DIAGNOSIS — I2510 Atherosclerotic heart disease of native coronary artery without angina pectoris: Secondary | ICD-10-CM | POA: Diagnosis not present

## 2017-04-22 DIAGNOSIS — I25119 Atherosclerotic heart disease of native coronary artery with unspecified angina pectoris: Secondary | ICD-10-CM | POA: Diagnosis not present

## 2017-04-22 DIAGNOSIS — I25709 Atherosclerosis of coronary artery bypass graft(s), unspecified, with unspecified angina pectoris: Secondary | ICD-10-CM | POA: Diagnosis not present

## 2017-05-04 DIAGNOSIS — I739 Peripheral vascular disease, unspecified: Secondary | ICD-10-CM | POA: Diagnosis not present

## 2017-05-04 DIAGNOSIS — I129 Hypertensive chronic kidney disease with stage 1 through stage 4 chronic kidney disease, or unspecified chronic kidney disease: Secondary | ICD-10-CM | POA: Diagnosis not present

## 2017-05-04 DIAGNOSIS — M109 Gout, unspecified: Secondary | ICD-10-CM | POA: Diagnosis not present

## 2017-05-04 DIAGNOSIS — N183 Chronic kidney disease, stage 3 (moderate): Secondary | ICD-10-CM | POA: Diagnosis not present

## 2017-05-04 DIAGNOSIS — D638 Anemia in other chronic diseases classified elsewhere: Secondary | ICD-10-CM | POA: Diagnosis not present

## 2017-05-04 DIAGNOSIS — I2510 Atherosclerotic heart disease of native coronary artery without angina pectoris: Secondary | ICD-10-CM | POA: Diagnosis not present

## 2017-05-04 DIAGNOSIS — R945 Abnormal results of liver function studies: Secondary | ICD-10-CM | POA: Diagnosis not present

## 2017-05-08 DIAGNOSIS — I70213 Atherosclerosis of native arteries of extremities with intermittent claudication, bilateral legs: Secondary | ICD-10-CM | POA: Diagnosis not present

## 2017-05-08 DIAGNOSIS — I739 Peripheral vascular disease, unspecified: Secondary | ICD-10-CM | POA: Diagnosis not present

## 2017-05-14 DIAGNOSIS — I25709 Atherosclerosis of coronary artery bypass graft(s), unspecified, with unspecified angina pectoris: Secondary | ICD-10-CM | POA: Diagnosis not present

## 2017-05-14 DIAGNOSIS — I25119 Atherosclerotic heart disease of native coronary artery with unspecified angina pectoris: Secondary | ICD-10-CM | POA: Diagnosis not present

## 2017-05-14 DIAGNOSIS — R079 Chest pain, unspecified: Secondary | ICD-10-CM | POA: Diagnosis not present

## 2017-05-25 DIAGNOSIS — H2512 Age-related nuclear cataract, left eye: Secondary | ICD-10-CM | POA: Diagnosis not present

## 2017-05-25 DIAGNOSIS — H2511 Age-related nuclear cataract, right eye: Secondary | ICD-10-CM | POA: Diagnosis not present

## 2017-05-25 DIAGNOSIS — H2513 Age-related nuclear cataract, bilateral: Secondary | ICD-10-CM | POA: Diagnosis not present

## 2017-05-26 DIAGNOSIS — H2511 Age-related nuclear cataract, right eye: Secondary | ICD-10-CM | POA: Diagnosis not present

## 2017-06-08 DIAGNOSIS — H2513 Age-related nuclear cataract, bilateral: Secondary | ICD-10-CM | POA: Diagnosis not present

## 2017-06-08 DIAGNOSIS — H2512 Age-related nuclear cataract, left eye: Secondary | ICD-10-CM | POA: Diagnosis not present

## 2017-06-11 DIAGNOSIS — G4733 Obstructive sleep apnea (adult) (pediatric): Secondary | ICD-10-CM | POA: Diagnosis not present

## 2017-06-11 DIAGNOSIS — R5383 Other fatigue: Secondary | ICD-10-CM | POA: Diagnosis not present

## 2017-06-11 DIAGNOSIS — J4540 Moderate persistent asthma, uncomplicated: Secondary | ICD-10-CM | POA: Diagnosis not present

## 2017-06-18 DIAGNOSIS — I2510 Atherosclerotic heart disease of native coronary artery without angina pectoris: Secondary | ICD-10-CM | POA: Diagnosis not present

## 2017-06-18 DIAGNOSIS — M109 Gout, unspecified: Secondary | ICD-10-CM | POA: Diagnosis not present

## 2017-06-18 DIAGNOSIS — N183 Chronic kidney disease, stage 3 (moderate): Secondary | ICD-10-CM | POA: Diagnosis not present

## 2017-06-18 DIAGNOSIS — D638 Anemia in other chronic diseases classified elsewhere: Secondary | ICD-10-CM | POA: Diagnosis not present

## 2017-06-18 DIAGNOSIS — J4540 Moderate persistent asthma, uncomplicated: Secondary | ICD-10-CM | POA: Diagnosis not present

## 2017-06-24 DIAGNOSIS — I10 Essential (primary) hypertension: Secondary | ICD-10-CM | POA: Diagnosis not present

## 2017-06-24 DIAGNOSIS — I70213 Atherosclerosis of native arteries of extremities with intermittent claudication, bilateral legs: Secondary | ICD-10-CM | POA: Diagnosis not present

## 2017-06-24 DIAGNOSIS — E7800 Pure hypercholesterolemia, unspecified: Secondary | ICD-10-CM | POA: Diagnosis not present

## 2017-06-24 DIAGNOSIS — I25119 Atherosclerotic heart disease of native coronary artery with unspecified angina pectoris: Secondary | ICD-10-CM | POA: Diagnosis not present

## 2017-06-24 DIAGNOSIS — I25709 Atherosclerosis of coronary artery bypass graft(s), unspecified, with unspecified angina pectoris: Secondary | ICD-10-CM | POA: Diagnosis not present

## 2017-07-01 DIAGNOSIS — G4733 Obstructive sleep apnea (adult) (pediatric): Secondary | ICD-10-CM | POA: Diagnosis not present

## 2017-07-20 DIAGNOSIS — F330 Major depressive disorder, recurrent, mild: Secondary | ICD-10-CM | POA: Diagnosis not present

## 2017-07-20 DIAGNOSIS — D638 Anemia in other chronic diseases classified elsewhere: Secondary | ICD-10-CM | POA: Diagnosis not present

## 2017-07-20 DIAGNOSIS — F419 Anxiety disorder, unspecified: Secondary | ICD-10-CM | POA: Diagnosis not present

## 2017-07-20 DIAGNOSIS — J40 Bronchitis, not specified as acute or chronic: Secondary | ICD-10-CM | POA: Diagnosis not present

## 2017-07-20 DIAGNOSIS — M109 Gout, unspecified: Secondary | ICD-10-CM | POA: Diagnosis not present

## 2017-07-20 DIAGNOSIS — J4540 Moderate persistent asthma, uncomplicated: Secondary | ICD-10-CM | POA: Diagnosis not present

## 2017-07-20 DIAGNOSIS — N183 Chronic kidney disease, stage 3 (moderate): Secondary | ICD-10-CM | POA: Diagnosis not present

## 2017-07-22 DIAGNOSIS — N179 Acute kidney failure, unspecified: Secondary | ICD-10-CM | POA: Diagnosis not present

## 2017-07-22 DIAGNOSIS — I25810 Atherosclerosis of coronary artery bypass graft(s) without angina pectoris: Secondary | ICD-10-CM | POA: Diagnosis not present

## 2017-07-22 DIAGNOSIS — N189 Chronic kidney disease, unspecified: Secondary | ICD-10-CM | POA: Diagnosis not present

## 2017-07-22 DIAGNOSIS — I447 Left bundle-branch block, unspecified: Secondary | ICD-10-CM | POA: Diagnosis not present

## 2017-07-22 DIAGNOSIS — I252 Old myocardial infarction: Secondary | ICD-10-CM | POA: Diagnosis not present

## 2017-07-22 DIAGNOSIS — E784 Other hyperlipidemia: Secondary | ICD-10-CM | POA: Diagnosis not present

## 2017-07-22 DIAGNOSIS — I70212 Atherosclerosis of native arteries of extremities with intermittent claudication, left leg: Secondary | ICD-10-CM | POA: Diagnosis not present

## 2017-07-22 DIAGNOSIS — I5022 Chronic systolic (congestive) heart failure: Secondary | ICD-10-CM | POA: Diagnosis not present

## 2017-07-22 DIAGNOSIS — I214 Non-ST elevation (NSTEMI) myocardial infarction: Secondary | ICD-10-CM | POA: Diagnosis not present

## 2017-07-22 DIAGNOSIS — I2510 Atherosclerotic heart disease of native coronary artery without angina pectoris: Secondary | ICD-10-CM | POA: Diagnosis not present

## 2017-07-22 DIAGNOSIS — I351 Nonrheumatic aortic (valve) insufficiency: Secondary | ICD-10-CM | POA: Diagnosis not present

## 2017-07-22 DIAGNOSIS — Z79891 Long term (current) use of opiate analgesic: Secondary | ICD-10-CM | POA: Diagnosis not present

## 2017-07-22 DIAGNOSIS — E785 Hyperlipidemia, unspecified: Secondary | ICD-10-CM | POA: Diagnosis not present

## 2017-07-22 DIAGNOSIS — S0990XA Unspecified injury of head, initial encounter: Secondary | ICD-10-CM | POA: Diagnosis not present

## 2017-07-22 DIAGNOSIS — I5020 Unspecified systolic (congestive) heart failure: Secondary | ICD-10-CM | POA: Diagnosis not present

## 2017-07-22 DIAGNOSIS — I083 Combined rheumatic disorders of mitral, aortic and tricuspid valves: Secondary | ICD-10-CM | POA: Diagnosis not present

## 2017-07-22 DIAGNOSIS — J9600 Acute respiratory failure, unspecified whether with hypoxia or hypercapnia: Secondary | ICD-10-CM | POA: Diagnosis not present

## 2017-07-22 DIAGNOSIS — R112 Nausea with vomiting, unspecified: Secondary | ICD-10-CM | POA: Diagnosis not present

## 2017-07-22 DIAGNOSIS — I495 Sick sinus syndrome: Secondary | ICD-10-CM | POA: Diagnosis not present

## 2017-07-22 DIAGNOSIS — R404 Transient alteration of awareness: Secondary | ICD-10-CM | POA: Diagnosis not present

## 2017-07-22 DIAGNOSIS — Z87891 Personal history of nicotine dependence: Secondary | ICD-10-CM | POA: Diagnosis not present

## 2017-07-22 DIAGNOSIS — J45909 Unspecified asthma, uncomplicated: Secondary | ICD-10-CM | POA: Diagnosis not present

## 2017-07-22 DIAGNOSIS — N183 Chronic kidney disease, stage 3 (moderate): Secondary | ICD-10-CM | POA: Diagnosis not present

## 2017-07-22 DIAGNOSIS — I340 Nonrheumatic mitral (valve) insufficiency: Secondary | ICD-10-CM | POA: Diagnosis not present

## 2017-07-22 DIAGNOSIS — I25118 Atherosclerotic heart disease of native coronary artery with other forms of angina pectoris: Secondary | ICD-10-CM | POA: Diagnosis not present

## 2017-07-22 DIAGNOSIS — I509 Heart failure, unspecified: Secondary | ICD-10-CM | POA: Diagnosis not present

## 2017-07-22 DIAGNOSIS — I130 Hypertensive heart and chronic kidney disease with heart failure and stage 1 through stage 4 chronic kidney disease, or unspecified chronic kidney disease: Secondary | ICD-10-CM | POA: Diagnosis not present

## 2017-07-22 DIAGNOSIS — I110 Hypertensive heart disease with heart failure: Secondary | ICD-10-CM | POA: Diagnosis not present

## 2017-07-22 DIAGNOSIS — I4510 Unspecified right bundle-branch block: Secondary | ICD-10-CM | POA: Diagnosis not present

## 2017-07-22 DIAGNOSIS — Z7982 Long term (current) use of aspirin: Secondary | ICD-10-CM | POA: Diagnosis not present

## 2017-07-22 DIAGNOSIS — R55 Syncope and collapse: Secondary | ICD-10-CM | POA: Diagnosis not present

## 2017-07-22 DIAGNOSIS — I25708 Atherosclerosis of coronary artery bypass graft(s), unspecified, with other forms of angina pectoris: Secondary | ICD-10-CM | POA: Diagnosis not present

## 2017-07-22 DIAGNOSIS — R109 Unspecified abdominal pain: Secondary | ICD-10-CM | POA: Diagnosis not present

## 2017-07-22 DIAGNOSIS — Z951 Presence of aortocoronary bypass graft: Secondary | ICD-10-CM | POA: Diagnosis not present

## 2017-07-22 DIAGNOSIS — R0789 Other chest pain: Secondary | ICD-10-CM | POA: Diagnosis not present

## 2017-07-22 DIAGNOSIS — I5023 Acute on chronic systolic (congestive) heart failure: Secondary | ICD-10-CM | POA: Diagnosis not present

## 2017-07-22 DIAGNOSIS — I4901 Ventricular fibrillation: Secondary | ICD-10-CM | POA: Diagnosis not present

## 2017-07-22 DIAGNOSIS — I255 Ischemic cardiomyopathy: Secondary | ICD-10-CM | POA: Diagnosis not present

## 2017-07-22 DIAGNOSIS — I469 Cardiac arrest, cause unspecified: Secondary | ICD-10-CM | POA: Diagnosis not present

## 2017-07-22 DIAGNOSIS — I361 Nonrheumatic tricuspid (valve) insufficiency: Secondary | ICD-10-CM | POA: Diagnosis not present

## 2017-07-22 DIAGNOSIS — J449 Chronic obstructive pulmonary disease, unspecified: Secondary | ICD-10-CM | POA: Diagnosis not present

## 2017-07-22 DIAGNOSIS — I739 Peripheral vascular disease, unspecified: Secondary | ICD-10-CM | POA: Diagnosis not present

## 2017-07-22 DIAGNOSIS — I470 Re-entry ventricular arrhythmia: Secondary | ICD-10-CM | POA: Diagnosis not present

## 2017-07-22 DIAGNOSIS — I501 Left ventricular failure: Secondary | ICD-10-CM | POA: Diagnosis not present

## 2017-07-22 DIAGNOSIS — R079 Chest pain, unspecified: Secondary | ICD-10-CM | POA: Diagnosis not present

## 2017-07-22 DIAGNOSIS — N2889 Other specified disorders of kidney and ureter: Secondary | ICD-10-CM | POA: Diagnosis not present

## 2017-07-22 DIAGNOSIS — I472 Ventricular tachycardia: Secondary | ICD-10-CM | POA: Diagnosis not present

## 2017-07-22 DIAGNOSIS — G4733 Obstructive sleep apnea (adult) (pediatric): Secondary | ICD-10-CM | POA: Diagnosis not present

## 2017-07-22 DIAGNOSIS — I700 Atherosclerosis of aorta: Secondary | ICD-10-CM | POA: Diagnosis not present

## 2017-07-22 DIAGNOSIS — I499 Cardiac arrhythmia, unspecified: Secondary | ICD-10-CM | POA: Diagnosis not present

## 2017-07-22 DIAGNOSIS — D649 Anemia, unspecified: Secondary | ICD-10-CM | POA: Diagnosis not present

## 2017-07-22 DIAGNOSIS — Z955 Presence of coronary angioplasty implant and graft: Secondary | ICD-10-CM | POA: Diagnosis not present

## 2017-07-22 DIAGNOSIS — F17211 Nicotine dependence, cigarettes, in remission: Secondary | ICD-10-CM | POA: Diagnosis not present

## 2017-07-23 DIAGNOSIS — I739 Peripheral vascular disease, unspecified: Secondary | ICD-10-CM | POA: Diagnosis not present

## 2017-07-23 DIAGNOSIS — I252 Old myocardial infarction: Secondary | ICD-10-CM | POA: Diagnosis not present

## 2017-07-23 DIAGNOSIS — I340 Nonrheumatic mitral (valve) insufficiency: Secondary | ICD-10-CM | POA: Diagnosis not present

## 2017-07-23 DIAGNOSIS — J45909 Unspecified asthma, uncomplicated: Secondary | ICD-10-CM | POA: Diagnosis not present

## 2017-07-23 DIAGNOSIS — F17211 Nicotine dependence, cigarettes, in remission: Secondary | ICD-10-CM | POA: Diagnosis not present

## 2017-07-23 DIAGNOSIS — I509 Heart failure, unspecified: Secondary | ICD-10-CM | POA: Diagnosis not present

## 2017-07-23 DIAGNOSIS — I447 Left bundle-branch block, unspecified: Secondary | ICD-10-CM | POA: Diagnosis not present

## 2017-07-23 DIAGNOSIS — I25708 Atherosclerosis of coronary artery bypass graft(s), unspecified, with other forms of angina pectoris: Secondary | ICD-10-CM | POA: Diagnosis not present

## 2017-07-23 DIAGNOSIS — R55 Syncope and collapse: Secondary | ICD-10-CM | POA: Diagnosis not present

## 2017-07-23 DIAGNOSIS — N2889 Other specified disorders of kidney and ureter: Secondary | ICD-10-CM | POA: Diagnosis not present

## 2017-07-23 DIAGNOSIS — N189 Chronic kidney disease, unspecified: Secondary | ICD-10-CM | POA: Diagnosis not present

## 2017-07-23 DIAGNOSIS — N179 Acute kidney failure, unspecified: Secondary | ICD-10-CM | POA: Diagnosis not present

## 2017-07-23 DIAGNOSIS — R112 Nausea with vomiting, unspecified: Secondary | ICD-10-CM | POA: Diagnosis not present

## 2017-07-23 DIAGNOSIS — I700 Atherosclerosis of aorta: Secondary | ICD-10-CM | POA: Diagnosis not present

## 2017-07-23 DIAGNOSIS — I469 Cardiac arrest, cause unspecified: Secondary | ICD-10-CM | POA: Diagnosis not present

## 2017-07-23 DIAGNOSIS — D649 Anemia, unspecified: Secondary | ICD-10-CM | POA: Diagnosis not present

## 2017-07-23 DIAGNOSIS — I083 Combined rheumatic disorders of mitral, aortic and tricuspid valves: Secondary | ICD-10-CM | POA: Diagnosis not present

## 2017-07-23 DIAGNOSIS — I2510 Atherosclerotic heart disease of native coronary artery without angina pectoris: Secondary | ICD-10-CM | POA: Diagnosis not present

## 2017-07-23 DIAGNOSIS — I5023 Acute on chronic systolic (congestive) heart failure: Secondary | ICD-10-CM | POA: Diagnosis not present

## 2017-07-23 DIAGNOSIS — N183 Chronic kidney disease, stage 3 (moderate): Secondary | ICD-10-CM | POA: Diagnosis not present

## 2017-07-23 DIAGNOSIS — J449 Chronic obstructive pulmonary disease, unspecified: Secondary | ICD-10-CM | POA: Diagnosis not present

## 2017-07-23 DIAGNOSIS — I130 Hypertensive heart and chronic kidney disease with heart failure and stage 1 through stage 4 chronic kidney disease, or unspecified chronic kidney disease: Secondary | ICD-10-CM | POA: Diagnosis not present

## 2017-07-23 DIAGNOSIS — I4510 Unspecified right bundle-branch block: Secondary | ICD-10-CM | POA: Diagnosis not present

## 2017-07-23 DIAGNOSIS — I499 Cardiac arrhythmia, unspecified: Secondary | ICD-10-CM | POA: Diagnosis not present

## 2017-07-23 DIAGNOSIS — E785 Hyperlipidemia, unspecified: Secondary | ICD-10-CM | POA: Diagnosis not present

## 2017-07-23 DIAGNOSIS — R079 Chest pain, unspecified: Secondary | ICD-10-CM | POA: Diagnosis not present

## 2017-07-23 DIAGNOSIS — I255 Ischemic cardiomyopathy: Secondary | ICD-10-CM | POA: Diagnosis not present

## 2017-07-23 DIAGNOSIS — I25810 Atherosclerosis of coronary artery bypass graft(s) without angina pectoris: Secondary | ICD-10-CM | POA: Diagnosis not present

## 2017-07-23 DIAGNOSIS — I214 Non-ST elevation (NSTEMI) myocardial infarction: Secondary | ICD-10-CM | POA: Diagnosis not present

## 2017-07-23 DIAGNOSIS — Z955 Presence of coronary angioplasty implant and graft: Secondary | ICD-10-CM | POA: Diagnosis not present

## 2017-07-23 DIAGNOSIS — Z951 Presence of aortocoronary bypass graft: Secondary | ICD-10-CM | POA: Diagnosis not present

## 2017-07-23 DIAGNOSIS — I5022 Chronic systolic (congestive) heart failure: Secondary | ICD-10-CM | POA: Diagnosis not present

## 2017-07-23 DIAGNOSIS — I470 Re-entry ventricular arrhythmia: Secondary | ICD-10-CM | POA: Diagnosis not present

## 2017-07-23 DIAGNOSIS — I4901 Ventricular fibrillation: Secondary | ICD-10-CM | POA: Diagnosis not present

## 2017-07-23 DIAGNOSIS — R109 Unspecified abdominal pain: Secondary | ICD-10-CM | POA: Diagnosis not present

## 2017-07-23 DIAGNOSIS — I472 Ventricular tachycardia: Secondary | ICD-10-CM | POA: Diagnosis not present

## 2017-07-23 DIAGNOSIS — E784 Other hyperlipidemia: Secondary | ICD-10-CM | POA: Diagnosis not present

## 2017-07-23 DIAGNOSIS — G4733 Obstructive sleep apnea (adult) (pediatric): Secondary | ICD-10-CM | POA: Diagnosis not present

## 2017-07-23 DIAGNOSIS — J9600 Acute respiratory failure, unspecified whether with hypoxia or hypercapnia: Secondary | ICD-10-CM | POA: Diagnosis not present

## 2017-07-23 DIAGNOSIS — I110 Hypertensive heart disease with heart failure: Secondary | ICD-10-CM | POA: Diagnosis not present

## 2017-08-12 DIAGNOSIS — Z95810 Presence of automatic (implantable) cardiac defibrillator: Secondary | ICD-10-CM | POA: Diagnosis not present

## 2017-08-12 DIAGNOSIS — I472 Ventricular tachycardia: Secondary | ICD-10-CM | POA: Diagnosis not present

## 2017-08-14 DIAGNOSIS — J4520 Mild intermittent asthma, uncomplicated: Secondary | ICD-10-CM | POA: Diagnosis not present

## 2017-08-14 DIAGNOSIS — R5383 Other fatigue: Secondary | ICD-10-CM | POA: Diagnosis not present

## 2017-08-14 DIAGNOSIS — G4733 Obstructive sleep apnea (adult) (pediatric): Secondary | ICD-10-CM | POA: Diagnosis not present

## 2017-08-24 DIAGNOSIS — I25118 Atherosclerotic heart disease of native coronary artery with other forms of angina pectoris: Secondary | ICD-10-CM | POA: Diagnosis not present

## 2017-08-24 DIAGNOSIS — I130 Hypertensive heart and chronic kidney disease with heart failure and stage 1 through stage 4 chronic kidney disease, or unspecified chronic kidney disease: Secondary | ICD-10-CM | POA: Diagnosis not present

## 2017-08-24 DIAGNOSIS — N183 Chronic kidney disease, stage 3 (moderate): Secondary | ICD-10-CM | POA: Diagnosis not present

## 2017-08-24 DIAGNOSIS — E1122 Type 2 diabetes mellitus with diabetic chronic kidney disease: Secondary | ICD-10-CM | POA: Diagnosis not present

## 2017-08-27 DIAGNOSIS — J449 Chronic obstructive pulmonary disease, unspecified: Secondary | ICD-10-CM | POA: Diagnosis not present

## 2017-08-27 DIAGNOSIS — Z955 Presence of coronary angioplasty implant and graft: Secondary | ICD-10-CM | POA: Diagnosis not present

## 2017-08-27 DIAGNOSIS — R0789 Other chest pain: Secondary | ICD-10-CM | POA: Diagnosis not present

## 2017-08-27 DIAGNOSIS — I252 Old myocardial infarction: Secondary | ICD-10-CM | POA: Diagnosis not present

## 2017-08-27 DIAGNOSIS — Z23 Encounter for immunization: Secondary | ICD-10-CM | POA: Diagnosis not present

## 2017-08-27 DIAGNOSIS — R55 Syncope and collapse: Secondary | ICD-10-CM | POA: Diagnosis not present

## 2017-08-27 DIAGNOSIS — Z951 Presence of aortocoronary bypass graft: Secondary | ICD-10-CM | POA: Diagnosis not present

## 2017-08-27 DIAGNOSIS — R079 Chest pain, unspecified: Secondary | ICD-10-CM | POA: Diagnosis not present

## 2017-08-27 DIAGNOSIS — I25709 Atherosclerosis of coronary artery bypass graft(s), unspecified, with unspecified angina pectoris: Secondary | ICD-10-CM | POA: Diagnosis not present

## 2017-08-27 DIAGNOSIS — I472 Ventricular tachycardia: Secondary | ICD-10-CM | POA: Diagnosis not present

## 2017-08-27 DIAGNOSIS — R9431 Abnormal electrocardiogram [ECG] [EKG]: Secondary | ICD-10-CM | POA: Diagnosis not present

## 2017-08-27 DIAGNOSIS — E785 Hyperlipidemia, unspecified: Secondary | ICD-10-CM | POA: Diagnosis not present

## 2017-08-27 DIAGNOSIS — I70213 Atherosclerosis of native arteries of extremities with intermittent claudication, bilateral legs: Secondary | ICD-10-CM | POA: Diagnosis not present

## 2017-08-27 DIAGNOSIS — Z95810 Presence of automatic (implantable) cardiac defibrillator: Secondary | ICD-10-CM | POA: Diagnosis not present

## 2017-08-27 DIAGNOSIS — G4733 Obstructive sleep apnea (adult) (pediatric): Secondary | ICD-10-CM | POA: Diagnosis not present

## 2017-08-27 DIAGNOSIS — I340 Nonrheumatic mitral (valve) insufficiency: Secondary | ICD-10-CM | POA: Diagnosis not present

## 2017-08-27 DIAGNOSIS — I5022 Chronic systolic (congestive) heart failure: Secondary | ICD-10-CM | POA: Diagnosis not present

## 2017-08-27 DIAGNOSIS — I1310 Hypertensive heart and chronic kidney disease without heart failure, with stage 1 through stage 4 chronic kidney disease, or unspecified chronic kidney disease: Secondary | ICD-10-CM | POA: Diagnosis not present

## 2017-08-27 DIAGNOSIS — I130 Hypertensive heart and chronic kidney disease with heart failure and stage 1 through stage 4 chronic kidney disease, or unspecified chronic kidney disease: Secondary | ICD-10-CM | POA: Diagnosis not present

## 2017-08-27 DIAGNOSIS — I2510 Atherosclerotic heart disease of native coronary artery without angina pectoris: Secondary | ICD-10-CM | POA: Diagnosis not present

## 2017-08-27 DIAGNOSIS — I255 Ischemic cardiomyopathy: Secondary | ICD-10-CM | POA: Diagnosis not present

## 2017-08-27 DIAGNOSIS — N189 Chronic kidney disease, unspecified: Secondary | ICD-10-CM | POA: Diagnosis not present

## 2017-08-28 DIAGNOSIS — I252 Old myocardial infarction: Secondary | ICD-10-CM | POA: Diagnosis not present

## 2017-08-28 DIAGNOSIS — Z950 Presence of cardiac pacemaker: Secondary | ICD-10-CM | POA: Diagnosis not present

## 2017-08-28 DIAGNOSIS — R9431 Abnormal electrocardiogram [ECG] [EKG]: Secondary | ICD-10-CM | POA: Diagnosis not present

## 2017-08-28 DIAGNOSIS — I471 Supraventricular tachycardia: Secondary | ICD-10-CM | POA: Diagnosis not present

## 2017-08-28 DIAGNOSIS — I5022 Chronic systolic (congestive) heart failure: Secondary | ICD-10-CM | POA: Diagnosis not present

## 2017-08-28 DIAGNOSIS — I25810 Atherosclerosis of coronary artery bypass graft(s) without angina pectoris: Secondary | ICD-10-CM | POA: Diagnosis not present

## 2017-08-28 DIAGNOSIS — I493 Ventricular premature depolarization: Secondary | ICD-10-CM | POA: Diagnosis not present

## 2017-08-29 DIAGNOSIS — I472 Ventricular tachycardia: Secondary | ICD-10-CM | POA: Diagnosis not present

## 2017-08-29 DIAGNOSIS — I252 Old myocardial infarction: Secondary | ICD-10-CM | POA: Diagnosis not present

## 2017-08-29 DIAGNOSIS — I25810 Atherosclerosis of coronary artery bypass graft(s) without angina pectoris: Secondary | ICD-10-CM | POA: Diagnosis not present

## 2017-08-29 DIAGNOSIS — I340 Nonrheumatic mitral (valve) insufficiency: Secondary | ICD-10-CM | POA: Diagnosis not present

## 2017-09-04 DIAGNOSIS — R079 Chest pain, unspecified: Secondary | ICD-10-CM | POA: Diagnosis not present

## 2017-09-04 DIAGNOSIS — I517 Cardiomegaly: Secondary | ICD-10-CM | POA: Diagnosis not present

## 2017-09-04 DIAGNOSIS — I252 Old myocardial infarction: Secondary | ICD-10-CM | POA: Diagnosis not present

## 2017-09-04 DIAGNOSIS — R9431 Abnormal electrocardiogram [ECG] [EKG]: Secondary | ICD-10-CM | POA: Diagnosis not present

## 2017-09-14 DIAGNOSIS — I25118 Atherosclerotic heart disease of native coronary artery with other forms of angina pectoris: Secondary | ICD-10-CM | POA: Diagnosis not present

## 2017-09-14 DIAGNOSIS — I130 Hypertensive heart and chronic kidney disease with heart failure and stage 1 through stage 4 chronic kidney disease, or unspecified chronic kidney disease: Secondary | ICD-10-CM | POA: Diagnosis not present

## 2017-09-14 DIAGNOSIS — I472 Ventricular tachycardia: Secondary | ICD-10-CM | POA: Diagnosis not present

## 2017-09-14 DIAGNOSIS — I739 Peripheral vascular disease, unspecified: Secondary | ICD-10-CM | POA: Diagnosis not present

## 2017-09-21 DIAGNOSIS — I739 Peripheral vascular disease, unspecified: Secondary | ICD-10-CM | POA: Diagnosis not present

## 2017-09-21 DIAGNOSIS — I25118 Atherosclerotic heart disease of native coronary artery with other forms of angina pectoris: Secondary | ICD-10-CM | POA: Diagnosis not present

## 2017-09-21 DIAGNOSIS — I472 Ventricular tachycardia: Secondary | ICD-10-CM | POA: Diagnosis not present

## 2017-09-21 DIAGNOSIS — I110 Hypertensive heart disease with heart failure: Secondary | ICD-10-CM | POA: Diagnosis not present

## 2017-10-29 DIAGNOSIS — Z95810 Presence of automatic (implantable) cardiac defibrillator: Secondary | ICD-10-CM | POA: Diagnosis not present

## 2018-01-11 DIAGNOSIS — I25118 Atherosclerotic heart disease of native coronary artery with other forms of angina pectoris: Secondary | ICD-10-CM | POA: Diagnosis not present

## 2018-01-11 DIAGNOSIS — I5022 Chronic systolic (congestive) heart failure: Secondary | ICD-10-CM | POA: Diagnosis not present

## 2018-01-11 DIAGNOSIS — G4730 Sleep apnea, unspecified: Secondary | ICD-10-CM | POA: Diagnosis not present

## 2018-01-11 DIAGNOSIS — J40 Bronchitis, not specified as acute or chronic: Secondary | ICD-10-CM | POA: Diagnosis not present

## 2018-01-21 DIAGNOSIS — R5381 Other malaise: Secondary | ICD-10-CM | POA: Diagnosis not present

## 2018-01-21 DIAGNOSIS — E1122 Type 2 diabetes mellitus with diabetic chronic kidney disease: Secondary | ICD-10-CM | POA: Diagnosis not present

## 2018-01-21 DIAGNOSIS — N183 Chronic kidney disease, stage 3 (moderate): Secondary | ICD-10-CM | POA: Diagnosis not present

## 2018-01-21 DIAGNOSIS — Z79899 Other long term (current) drug therapy: Secondary | ICD-10-CM | POA: Diagnosis not present

## 2018-01-21 DIAGNOSIS — I5022 Chronic systolic (congestive) heart failure: Secondary | ICD-10-CM | POA: Diagnosis not present

## 2018-01-21 DIAGNOSIS — I130 Hypertensive heart and chronic kidney disease with heart failure and stage 1 through stage 4 chronic kidney disease, or unspecified chronic kidney disease: Secondary | ICD-10-CM | POA: Diagnosis not present

## 2018-01-21 DIAGNOSIS — D638 Anemia in other chronic diseases classified elsewhere: Secondary | ICD-10-CM | POA: Diagnosis not present

## 2018-01-21 DIAGNOSIS — E782 Mixed hyperlipidemia: Secondary | ICD-10-CM | POA: Diagnosis not present

## 2018-01-21 DIAGNOSIS — R5383 Other fatigue: Secondary | ICD-10-CM | POA: Diagnosis not present

## 2018-01-26 DIAGNOSIS — I472 Ventricular tachycardia: Secondary | ICD-10-CM | POA: Diagnosis not present

## 2018-01-26 DIAGNOSIS — I110 Hypertensive heart disease with heart failure: Secondary | ICD-10-CM | POA: Diagnosis not present

## 2018-01-26 DIAGNOSIS — I25118 Atherosclerotic heart disease of native coronary artery with other forms of angina pectoris: Secondary | ICD-10-CM | POA: Diagnosis not present

## 2018-01-26 DIAGNOSIS — I5022 Chronic systolic (congestive) heart failure: Secondary | ICD-10-CM | POA: Diagnosis not present

## 2018-01-27 DIAGNOSIS — I5022 Chronic systolic (congestive) heart failure: Secondary | ICD-10-CM | POA: Diagnosis not present

## 2018-01-27 DIAGNOSIS — G4730 Sleep apnea, unspecified: Secondary | ICD-10-CM | POA: Diagnosis not present

## 2018-01-28 DIAGNOSIS — Z95810 Presence of automatic (implantable) cardiac defibrillator: Secondary | ICD-10-CM | POA: Diagnosis not present

## 2018-02-08 DIAGNOSIS — J4540 Moderate persistent asthma, uncomplicated: Secondary | ICD-10-CM | POA: Diagnosis not present

## 2018-02-08 DIAGNOSIS — I5022 Chronic systolic (congestive) heart failure: Secondary | ICD-10-CM | POA: Diagnosis not present

## 2018-02-08 DIAGNOSIS — G4730 Sleep apnea, unspecified: Secondary | ICD-10-CM | POA: Diagnosis not present

## 2018-02-08 DIAGNOSIS — J90 Pleural effusion, not elsewhere classified: Secondary | ICD-10-CM | POA: Diagnosis not present

## 2018-03-09 DIAGNOSIS — H04123 Dry eye syndrome of bilateral lacrimal glands: Secondary | ICD-10-CM | POA: Diagnosis not present

## 2018-03-09 DIAGNOSIS — H5203 Hypermetropia, bilateral: Secondary | ICD-10-CM | POA: Diagnosis not present

## 2018-03-09 DIAGNOSIS — Z961 Presence of intraocular lens: Secondary | ICD-10-CM | POA: Diagnosis not present

## 2018-03-15 DIAGNOSIS — G4730 Sleep apnea, unspecified: Secondary | ICD-10-CM | POA: Diagnosis not present

## 2018-03-24 DIAGNOSIS — Z87891 Personal history of nicotine dependence: Secondary | ICD-10-CM | POA: Diagnosis not present

## 2018-03-24 DIAGNOSIS — I252 Old myocardial infarction: Secondary | ICD-10-CM | POA: Diagnosis not present

## 2018-03-24 DIAGNOSIS — I255 Ischemic cardiomyopathy: Secondary | ICD-10-CM | POA: Diagnosis not present

## 2018-03-24 DIAGNOSIS — R079 Chest pain, unspecified: Secondary | ICD-10-CM | POA: Diagnosis not present

## 2018-03-24 DIAGNOSIS — J449 Chronic obstructive pulmonary disease, unspecified: Secondary | ICD-10-CM | POA: Diagnosis not present

## 2018-03-24 DIAGNOSIS — I272 Pulmonary hypertension, unspecified: Secondary | ICD-10-CM | POA: Diagnosis not present

## 2018-03-24 DIAGNOSIS — I472 Ventricular tachycardia: Secondary | ICD-10-CM | POA: Diagnosis not present

## 2018-03-24 DIAGNOSIS — Z7902 Long term (current) use of antithrombotics/antiplatelets: Secondary | ICD-10-CM | POA: Diagnosis not present

## 2018-03-24 DIAGNOSIS — Z79899 Other long term (current) drug therapy: Secondary | ICD-10-CM | POA: Diagnosis not present

## 2018-03-24 DIAGNOSIS — I110 Hypertensive heart disease with heart failure: Secondary | ICD-10-CM | POA: Diagnosis not present

## 2018-03-24 DIAGNOSIS — R062 Wheezing: Secondary | ICD-10-CM | POA: Diagnosis not present

## 2018-03-24 DIAGNOSIS — E782 Mixed hyperlipidemia: Secondary | ICD-10-CM | POA: Diagnosis not present

## 2018-03-24 DIAGNOSIS — Z951 Presence of aortocoronary bypass graft: Secondary | ICD-10-CM | POA: Diagnosis not present

## 2018-03-24 DIAGNOSIS — Z7982 Long term (current) use of aspirin: Secondary | ICD-10-CM | POA: Diagnosis not present

## 2018-03-24 DIAGNOSIS — I371 Nonrheumatic pulmonary valve insufficiency: Secondary | ICD-10-CM | POA: Diagnosis not present

## 2018-03-24 DIAGNOSIS — I5023 Acute on chronic systolic (congestive) heart failure: Secondary | ICD-10-CM | POA: Diagnosis not present

## 2018-03-24 DIAGNOSIS — Z95810 Presence of automatic (implantable) cardiac defibrillator: Secondary | ICD-10-CM | POA: Diagnosis not present

## 2018-03-24 DIAGNOSIS — R0602 Shortness of breath: Secondary | ICD-10-CM | POA: Diagnosis not present

## 2018-03-24 DIAGNOSIS — I340 Nonrheumatic mitral (valve) insufficiency: Secondary | ICD-10-CM | POA: Diagnosis not present

## 2018-03-24 DIAGNOSIS — J9690 Respiratory failure, unspecified, unspecified whether with hypoxia or hypercapnia: Secondary | ICD-10-CM | POA: Diagnosis not present

## 2018-03-24 DIAGNOSIS — E1151 Type 2 diabetes mellitus with diabetic peripheral angiopathy without gangrene: Secondary | ICD-10-CM | POA: Diagnosis not present

## 2018-03-24 DIAGNOSIS — N183 Chronic kidney disease, stage 3 (moderate): Secondary | ICD-10-CM | POA: Diagnosis not present

## 2018-03-24 DIAGNOSIS — I5022 Chronic systolic (congestive) heart failure: Secondary | ICD-10-CM | POA: Diagnosis not present

## 2018-03-24 DIAGNOSIS — R05 Cough: Secondary | ICD-10-CM | POA: Diagnosis not present

## 2018-03-24 DIAGNOSIS — K219 Gastro-esophageal reflux disease without esophagitis: Secondary | ICD-10-CM | POA: Diagnosis not present

## 2018-03-24 DIAGNOSIS — I352 Nonrheumatic aortic (valve) stenosis with insufficiency: Secondary | ICD-10-CM | POA: Diagnosis not present

## 2018-03-24 DIAGNOSIS — I2510 Atherosclerotic heart disease of native coronary artery without angina pectoris: Secondary | ICD-10-CM | POA: Diagnosis not present

## 2018-03-24 DIAGNOSIS — I130 Hypertensive heart and chronic kidney disease with heart failure and stage 1 through stage 4 chronic kidney disease, or unspecified chronic kidney disease: Secondary | ICD-10-CM | POA: Diagnosis not present

## 2018-03-24 DIAGNOSIS — I420 Dilated cardiomyopathy: Secondary | ICD-10-CM | POA: Diagnosis not present

## 2018-03-24 DIAGNOSIS — G4733 Obstructive sleep apnea (adult) (pediatric): Secondary | ICD-10-CM | POA: Diagnosis not present

## 2018-03-24 DIAGNOSIS — J4540 Moderate persistent asthma, uncomplicated: Secondary | ICD-10-CM | POA: Diagnosis not present

## 2018-03-24 DIAGNOSIS — E1122 Type 2 diabetes mellitus with diabetic chronic kidney disease: Secondary | ICD-10-CM | POA: Diagnosis not present

## 2018-03-30 ENCOUNTER — Other Ambulatory Visit: Payer: Self-pay

## 2018-03-30 NOTE — Patient Outreach (Signed)
[facility]) Care Management  03/30/2018  Makena Prairie 1941/04/23 [id number]  Transition of care  Transition of care will be completed by patients primary care provider office who will refer to [facility] care management if needed.   PLAN; RNCM will close patient due to being enrolled in an external program   Robel Ina RN,BSN,CCM [facility] Telephonic  [phone]

## 2018-04-01 DIAGNOSIS — I5043 Acute on chronic combined systolic (congestive) and diastolic (congestive) heart failure: Secondary | ICD-10-CM | POA: Diagnosis not present

## 2018-04-01 DIAGNOSIS — G4730 Sleep apnea, unspecified: Secondary | ICD-10-CM | POA: Diagnosis not present

## 2018-04-01 DIAGNOSIS — J449 Chronic obstructive pulmonary disease, unspecified: Secondary | ICD-10-CM | POA: Diagnosis not present

## 2018-04-01 DIAGNOSIS — N183 Chronic kidney disease, stage 3 (moderate): Secondary | ICD-10-CM | POA: Diagnosis not present

## 2018-04-08 DIAGNOSIS — D638 Anemia in other chronic diseases classified elsewhere: Secondary | ICD-10-CM | POA: Diagnosis not present

## 2018-04-08 DIAGNOSIS — N183 Chronic kidney disease, stage 3 (moderate): Secondary | ICD-10-CM | POA: Diagnosis not present

## 2018-04-08 DIAGNOSIS — I25118 Atherosclerotic heart disease of native coronary artery with other forms of angina pectoris: Secondary | ICD-10-CM | POA: Diagnosis not present

## 2018-04-08 DIAGNOSIS — I130 Hypertensive heart and chronic kidney disease with heart failure and stage 1 through stage 4 chronic kidney disease, or unspecified chronic kidney disease: Secondary | ICD-10-CM | POA: Diagnosis not present

## 2018-04-08 DIAGNOSIS — I5043 Acute on chronic combined systolic (congestive) and diastolic (congestive) heart failure: Secondary | ICD-10-CM | POA: Diagnosis not present

## 2018-04-12 DIAGNOSIS — N183 Chronic kidney disease, stage 3 (moderate): Secondary | ICD-10-CM | POA: Diagnosis not present

## 2018-04-12 DIAGNOSIS — R748 Abnormal levels of other serum enzymes: Secondary | ICD-10-CM | POA: Diagnosis not present

## 2018-04-13 DIAGNOSIS — R748 Abnormal levels of other serum enzymes: Secondary | ICD-10-CM | POA: Diagnosis not present

## 2018-04-29 DIAGNOSIS — R748 Abnormal levels of other serum enzymes: Secondary | ICD-10-CM | POA: Diagnosis not present

## 2018-04-29 DIAGNOSIS — Z4502 Encounter for adjustment and management of automatic implantable cardiac defibrillator: Secondary | ICD-10-CM | POA: Diagnosis not present

## 2018-04-29 DIAGNOSIS — I509 Heart failure, unspecified: Secondary | ICD-10-CM | POA: Diagnosis not present

## 2018-05-03 DIAGNOSIS — I130 Hypertensive heart and chronic kidney disease with heart failure and stage 1 through stage 4 chronic kidney disease, or unspecified chronic kidney disease: Secondary | ICD-10-CM | POA: Diagnosis not present

## 2018-05-03 DIAGNOSIS — I5022 Chronic systolic (congestive) heart failure: Secondary | ICD-10-CM | POA: Diagnosis not present

## 2018-05-03 DIAGNOSIS — I70213 Atherosclerosis of native arteries of extremities with intermittent claudication, bilateral legs: Secondary | ICD-10-CM | POA: Diagnosis not present

## 2018-05-03 DIAGNOSIS — I25118 Atherosclerotic heart disease of native coronary artery with other forms of angina pectoris: Secondary | ICD-10-CM | POA: Diagnosis not present

## 2018-07-29 DIAGNOSIS — Z4502 Encounter for adjustment and management of automatic implantable cardiac defibrillator: Secondary | ICD-10-CM | POA: Diagnosis not present

## 2018-08-26 DIAGNOSIS — H04123 Dry eye syndrome of bilateral lacrimal glands: Secondary | ICD-10-CM | POA: Diagnosis not present

## 2018-11-09 DIAGNOSIS — M25562 Pain in left knee: Secondary | ICD-10-CM | POA: Diagnosis not present

## 2018-11-15 DIAGNOSIS — I25118 Atherosclerotic heart disease of native coronary artery with other forms of angina pectoris: Secondary | ICD-10-CM | POA: Diagnosis not present

## 2018-11-15 DIAGNOSIS — E782 Mixed hyperlipidemia: Secondary | ICD-10-CM | POA: Diagnosis not present

## 2018-11-15 DIAGNOSIS — I340 Nonrheumatic mitral (valve) insufficiency: Secondary | ICD-10-CM | POA: Diagnosis not present

## 2018-11-15 DIAGNOSIS — I70213 Atherosclerosis of native arteries of extremities with intermittent claudication, bilateral legs: Secondary | ICD-10-CM | POA: Diagnosis not present

## 2018-11-15 DIAGNOSIS — I5022 Chronic systolic (congestive) heart failure: Secondary | ICD-10-CM | POA: Diagnosis not present

## 2018-11-15 DIAGNOSIS — I472 Ventricular tachycardia: Secondary | ICD-10-CM | POA: Diagnosis not present

## 2018-11-15 DIAGNOSIS — Z951 Presence of aortocoronary bypass graft: Secondary | ICD-10-CM | POA: Diagnosis not present

## 2018-11-15 DIAGNOSIS — I110 Hypertensive heart disease with heart failure: Secondary | ICD-10-CM | POA: Diagnosis not present

## 2018-11-15 DIAGNOSIS — E1122 Type 2 diabetes mellitus with diabetic chronic kidney disease: Secondary | ICD-10-CM | POA: Diagnosis not present

## 2018-11-15 DIAGNOSIS — N183 Chronic kidney disease, stage 3 (moderate): Secondary | ICD-10-CM | POA: Diagnosis not present

## 2018-11-18 DIAGNOSIS — M25562 Pain in left knee: Secondary | ICD-10-CM | POA: Diagnosis not present

## 2018-11-19 DIAGNOSIS — Z87891 Personal history of nicotine dependence: Secondary | ICD-10-CM | POA: Diagnosis not present

## 2018-11-19 DIAGNOSIS — I70213 Atherosclerosis of native arteries of extremities with intermittent claudication, bilateral legs: Secondary | ICD-10-CM | POA: Diagnosis not present

## 2018-11-25 DIAGNOSIS — Z0000 Encounter for general adult medical examination without abnormal findings: Secondary | ICD-10-CM | POA: Diagnosis not present

## 2018-11-25 DIAGNOSIS — N183 Chronic kidney disease, stage 3 (moderate): Secondary | ICD-10-CM | POA: Diagnosis not present

## 2018-11-25 DIAGNOSIS — I25118 Atherosclerotic heart disease of native coronary artery with other forms of angina pectoris: Secondary | ICD-10-CM | POA: Diagnosis not present

## 2018-11-25 DIAGNOSIS — Z23 Encounter for immunization: Secondary | ICD-10-CM | POA: Diagnosis not present

## 2018-11-25 DIAGNOSIS — Z79899 Other long term (current) drug therapy: Secondary | ICD-10-CM | POA: Diagnosis not present

## 2018-11-25 DIAGNOSIS — I70213 Atherosclerosis of native arteries of extremities with intermittent claudication, bilateral legs: Secondary | ICD-10-CM | POA: Diagnosis not present

## 2018-11-25 DIAGNOSIS — D638 Anemia in other chronic diseases classified elsewhere: Secondary | ICD-10-CM | POA: Diagnosis not present

## 2018-11-25 DIAGNOSIS — M1A00X0 Idiopathic chronic gout, unspecified site, without tophus (tophi): Secondary | ICD-10-CM | POA: Diagnosis not present

## 2018-11-25 DIAGNOSIS — F5101 Primary insomnia: Secondary | ICD-10-CM | POA: Diagnosis not present

## 2018-11-25 DIAGNOSIS — I130 Hypertensive heart and chronic kidney disease with heart failure and stage 1 through stage 4 chronic kidney disease, or unspecified chronic kidney disease: Secondary | ICD-10-CM | POA: Diagnosis not present

## 2018-11-25 DIAGNOSIS — I472 Ventricular tachycardia: Secondary | ICD-10-CM | POA: Diagnosis not present

## 2018-11-25 DIAGNOSIS — K219 Gastro-esophageal reflux disease without esophagitis: Secondary | ICD-10-CM | POA: Diagnosis not present

## 2018-11-25 DIAGNOSIS — I5022 Chronic systolic (congestive) heart failure: Secondary | ICD-10-CM | POA: Diagnosis not present

## 2018-11-25 DIAGNOSIS — I129 Hypertensive chronic kidney disease with stage 1 through stage 4 chronic kidney disease, or unspecified chronic kidney disease: Secondary | ICD-10-CM | POA: Diagnosis not present

## 2018-11-25 DIAGNOSIS — G4730 Sleep apnea, unspecified: Secondary | ICD-10-CM | POA: Diagnosis not present

## 2018-11-25 DIAGNOSIS — F330 Major depressive disorder, recurrent, mild: Secondary | ICD-10-CM | POA: Diagnosis not present

## 2018-11-25 DIAGNOSIS — E1122 Type 2 diabetes mellitus with diabetic chronic kidney disease: Secondary | ICD-10-CM | POA: Diagnosis not present

## 2019-02-15 DIAGNOSIS — I509 Heart failure, unspecified: Secondary | ICD-10-CM | POA: Diagnosis not present

## 2019-02-15 DIAGNOSIS — Z4502 Encounter for adjustment and management of automatic implantable cardiac defibrillator: Secondary | ICD-10-CM | POA: Diagnosis not present

## 2019-05-13 DIAGNOSIS — I340 Nonrheumatic mitral (valve) insufficiency: Secondary | ICD-10-CM | POA: Diagnosis not present

## 2019-05-13 DIAGNOSIS — Z951 Presence of aortocoronary bypass graft: Secondary | ICD-10-CM | POA: Diagnosis not present

## 2019-05-13 DIAGNOSIS — I110 Hypertensive heart disease with heart failure: Secondary | ICD-10-CM | POA: Diagnosis not present

## 2019-05-13 DIAGNOSIS — E782 Mixed hyperlipidemia: Secondary | ICD-10-CM | POA: Diagnosis not present

## 2019-05-13 DIAGNOSIS — N183 Chronic kidney disease, stage 3 (moderate): Secondary | ICD-10-CM | POA: Diagnosis not present

## 2019-05-13 DIAGNOSIS — E1122 Type 2 diabetes mellitus with diabetic chronic kidney disease: Secondary | ICD-10-CM | POA: Diagnosis not present

## 2019-05-13 DIAGNOSIS — I70213 Atherosclerosis of native arteries of extremities with intermittent claudication, bilateral legs: Secondary | ICD-10-CM | POA: Diagnosis not present

## 2019-05-13 DIAGNOSIS — I25118 Atherosclerotic heart disease of native coronary artery with other forms of angina pectoris: Secondary | ICD-10-CM | POA: Diagnosis not present

## 2019-05-13 DIAGNOSIS — I5022 Chronic systolic (congestive) heart failure: Secondary | ICD-10-CM | POA: Diagnosis not present

## 2019-05-17 DIAGNOSIS — I509 Heart failure, unspecified: Secondary | ICD-10-CM | POA: Diagnosis not present

## 2019-05-17 DIAGNOSIS — Z95810 Presence of automatic (implantable) cardiac defibrillator: Secondary | ICD-10-CM | POA: Diagnosis not present

## 2019-05-23 DIAGNOSIS — I70213 Atherosclerosis of native arteries of extremities with intermittent claudication, bilateral legs: Secondary | ICD-10-CM | POA: Diagnosis not present

## 2019-05-24 DIAGNOSIS — I70213 Atherosclerosis of native arteries of extremities with intermittent claudication, bilateral legs: Secondary | ICD-10-CM | POA: Diagnosis not present

## 2019-05-24 DIAGNOSIS — I708 Atherosclerosis of other arteries: Secondary | ICD-10-CM | POA: Diagnosis not present

## 2019-05-24 DIAGNOSIS — I701 Atherosclerosis of renal artery: Secondary | ICD-10-CM | POA: Diagnosis not present

## 2019-05-31 DIAGNOSIS — I70213 Atherosclerosis of native arteries of extremities with intermittent claudication, bilateral legs: Secondary | ICD-10-CM | POA: Diagnosis not present

## 2019-06-06 DIAGNOSIS — I110 Hypertensive heart disease with heart failure: Secondary | ICD-10-CM | POA: Diagnosis not present

## 2019-06-06 DIAGNOSIS — I252 Old myocardial infarction: Secondary | ICD-10-CM | POA: Diagnosis not present

## 2019-06-06 DIAGNOSIS — I2510 Atherosclerotic heart disease of native coronary artery without angina pectoris: Secondary | ICD-10-CM | POA: Diagnosis not present

## 2019-06-06 DIAGNOSIS — I5022 Chronic systolic (congestive) heart failure: Secondary | ICD-10-CM | POA: Diagnosis not present

## 2019-06-06 DIAGNOSIS — Z951 Presence of aortocoronary bypass graft: Secondary | ICD-10-CM | POA: Diagnosis not present

## 2019-06-07 DIAGNOSIS — N183 Chronic kidney disease, stage 3 (moderate): Secondary | ICD-10-CM | POA: Diagnosis not present

## 2019-06-14 DIAGNOSIS — I5022 Chronic systolic (congestive) heart failure: Secondary | ICD-10-CM | POA: Diagnosis not present

## 2019-06-14 DIAGNOSIS — I252 Old myocardial infarction: Secondary | ICD-10-CM | POA: Diagnosis not present

## 2019-06-14 DIAGNOSIS — Z951 Presence of aortocoronary bypass graft: Secondary | ICD-10-CM | POA: Diagnosis not present

## 2019-06-14 DIAGNOSIS — I472 Ventricular tachycardia: Secondary | ICD-10-CM | POA: Diagnosis not present

## 2019-06-14 DIAGNOSIS — J449 Chronic obstructive pulmonary disease, unspecified: Secondary | ICD-10-CM | POA: Diagnosis not present

## 2019-06-14 DIAGNOSIS — Z7902 Long term (current) use of antithrombotics/antiplatelets: Secondary | ICD-10-CM | POA: Diagnosis not present

## 2019-06-14 DIAGNOSIS — N189 Chronic kidney disease, unspecified: Secondary | ICD-10-CM | POA: Diagnosis not present

## 2019-06-14 DIAGNOSIS — Z79899 Other long term (current) drug therapy: Secondary | ICD-10-CM | POA: Diagnosis not present

## 2019-06-14 DIAGNOSIS — E785 Hyperlipidemia, unspecified: Secondary | ICD-10-CM | POA: Diagnosis not present

## 2019-06-14 DIAGNOSIS — G4733 Obstructive sleep apnea (adult) (pediatric): Secondary | ICD-10-CM | POA: Diagnosis not present

## 2019-06-14 DIAGNOSIS — I130 Hypertensive heart and chronic kidney disease with heart failure and stage 1 through stage 4 chronic kidney disease, or unspecified chronic kidney disease: Secondary | ICD-10-CM | POA: Diagnosis not present

## 2019-06-14 DIAGNOSIS — Z7982 Long term (current) use of aspirin: Secondary | ICD-10-CM | POA: Diagnosis not present

## 2019-06-14 DIAGNOSIS — I70213 Atherosclerosis of native arteries of extremities with intermittent claudication, bilateral legs: Secondary | ICD-10-CM | POA: Diagnosis not present

## 2019-07-04 DIAGNOSIS — I70211 Atherosclerosis of native arteries of extremities with intermittent claudication, right leg: Secondary | ICD-10-CM | POA: Diagnosis not present

## 2019-07-04 DIAGNOSIS — G4733 Obstructive sleep apnea (adult) (pediatric): Secondary | ICD-10-CM | POA: Diagnosis not present

## 2019-07-04 DIAGNOSIS — I340 Nonrheumatic mitral (valve) insufficiency: Secondary | ICD-10-CM | POA: Diagnosis not present

## 2019-07-04 DIAGNOSIS — E1122 Type 2 diabetes mellitus with diabetic chronic kidney disease: Secondary | ICD-10-CM | POA: Diagnosis not present

## 2019-07-04 DIAGNOSIS — I70201 Unspecified atherosclerosis of native arteries of extremities, right leg: Secondary | ICD-10-CM | POA: Diagnosis not present

## 2019-07-04 DIAGNOSIS — I252 Old myocardial infarction: Secondary | ICD-10-CM | POA: Diagnosis not present

## 2019-07-04 DIAGNOSIS — I25118 Atherosclerotic heart disease of native coronary artery with other forms of angina pectoris: Secondary | ICD-10-CM | POA: Diagnosis not present

## 2019-07-04 DIAGNOSIS — I70213 Atherosclerosis of native arteries of extremities with intermittent claudication, bilateral legs: Secondary | ICD-10-CM | POA: Diagnosis not present

## 2019-07-04 DIAGNOSIS — I472 Ventricular tachycardia: Secondary | ICD-10-CM | POA: Diagnosis not present

## 2019-07-04 DIAGNOSIS — E782 Mixed hyperlipidemia: Secondary | ICD-10-CM | POA: Diagnosis not present

## 2019-07-04 DIAGNOSIS — I739 Peripheral vascular disease, unspecified: Secondary | ICD-10-CM | POA: Diagnosis not present

## 2019-07-04 DIAGNOSIS — Z95810 Presence of automatic (implantable) cardiac defibrillator: Secondary | ICD-10-CM | POA: Diagnosis not present

## 2019-07-04 DIAGNOSIS — I5022 Chronic systolic (congestive) heart failure: Secondary | ICD-10-CM | POA: Diagnosis not present

## 2019-07-04 DIAGNOSIS — I130 Hypertensive heart and chronic kidney disease with heart failure and stage 1 through stage 4 chronic kidney disease, or unspecified chronic kidney disease: Secondary | ICD-10-CM | POA: Diagnosis not present

## 2019-07-04 DIAGNOSIS — Z951 Presence of aortocoronary bypass graft: Secondary | ICD-10-CM | POA: Diagnosis not present

## 2019-07-12 DIAGNOSIS — D638 Anemia in other chronic diseases classified elsewhere: Secondary | ICD-10-CM | POA: Diagnosis not present

## 2019-07-12 DIAGNOSIS — I70213 Atherosclerosis of native arteries of extremities with intermittent claudication, bilateral legs: Secondary | ICD-10-CM | POA: Diagnosis not present

## 2019-07-12 DIAGNOSIS — Z79899 Other long term (current) drug therapy: Secondary | ICD-10-CM | POA: Diagnosis not present

## 2019-07-12 DIAGNOSIS — E1122 Type 2 diabetes mellitus with diabetic chronic kidney disease: Secondary | ICD-10-CM | POA: Diagnosis not present

## 2019-07-12 DIAGNOSIS — F330 Major depressive disorder, recurrent, mild: Secondary | ICD-10-CM | POA: Diagnosis not present

## 2019-07-12 DIAGNOSIS — I10 Essential (primary) hypertension: Secondary | ICD-10-CM | POA: Diagnosis not present

## 2019-07-12 DIAGNOSIS — N183 Chronic kidney disease, stage 3 (moderate): Secondary | ICD-10-CM | POA: Diagnosis not present

## 2019-07-12 DIAGNOSIS — I129 Hypertensive chronic kidney disease with stage 1 through stage 4 chronic kidney disease, or unspecified chronic kidney disease: Secondary | ICD-10-CM | POA: Diagnosis not present

## 2019-07-12 DIAGNOSIS — E782 Mixed hyperlipidemia: Secondary | ICD-10-CM | POA: Diagnosis not present

## 2019-07-12 DIAGNOSIS — M48062 Spinal stenosis, lumbar region with neurogenic claudication: Secondary | ICD-10-CM | POA: Diagnosis not present

## 2019-07-12 DIAGNOSIS — I25118 Atherosclerotic heart disease of native coronary artery with other forms of angina pectoris: Secondary | ICD-10-CM | POA: Diagnosis not present

## 2019-07-12 DIAGNOSIS — I472 Ventricular tachycardia: Secondary | ICD-10-CM | POA: Diagnosis not present

## 2019-07-12 DIAGNOSIS — I5022 Chronic systolic (congestive) heart failure: Secondary | ICD-10-CM | POA: Diagnosis not present

## 2019-07-16 DIAGNOSIS — Z87891 Personal history of nicotine dependence: Secondary | ICD-10-CM | POA: Diagnosis not present

## 2019-07-16 DIAGNOSIS — D638 Anemia in other chronic diseases classified elsewhere: Secondary | ICD-10-CM | POA: Diagnosis not present

## 2019-07-16 DIAGNOSIS — Z955 Presence of coronary angioplasty implant and graft: Secondary | ICD-10-CM | POA: Diagnosis not present

## 2019-07-16 DIAGNOSIS — R112 Nausea with vomiting, unspecified: Secondary | ICD-10-CM | POA: Diagnosis not present

## 2019-07-16 DIAGNOSIS — I2510 Atherosclerotic heart disease of native coronary artery without angina pectoris: Secondary | ICD-10-CM | POA: Diagnosis not present

## 2019-07-16 DIAGNOSIS — K5730 Diverticulosis of large intestine without perforation or abscess without bleeding: Secondary | ICD-10-CM | POA: Diagnosis not present

## 2019-07-16 DIAGNOSIS — K31819 Angiodysplasia of stomach and duodenum without bleeding: Secondary | ICD-10-CM | POA: Diagnosis not present

## 2019-07-16 DIAGNOSIS — K226 Gastro-esophageal laceration-hemorrhage syndrome: Secondary | ICD-10-CM | POA: Diagnosis not present

## 2019-07-16 DIAGNOSIS — K921 Melena: Secondary | ICD-10-CM | POA: Diagnosis not present

## 2019-07-16 DIAGNOSIS — Z951 Presence of aortocoronary bypass graft: Secondary | ICD-10-CM | POA: Diagnosis not present

## 2019-07-16 DIAGNOSIS — I252 Old myocardial infarction: Secondary | ICD-10-CM | POA: Diagnosis not present

## 2019-07-16 DIAGNOSIS — R072 Precordial pain: Secondary | ICD-10-CM | POA: Diagnosis not present

## 2019-07-16 DIAGNOSIS — G4733 Obstructive sleep apnea (adult) (pediatric): Secondary | ICD-10-CM | POA: Diagnosis not present

## 2019-07-16 DIAGNOSIS — Z95810 Presence of automatic (implantable) cardiac defibrillator: Secondary | ICD-10-CM | POA: Diagnosis not present

## 2019-07-16 DIAGNOSIS — R579 Shock, unspecified: Secondary | ICD-10-CM | POA: Diagnosis not present

## 2019-07-16 DIAGNOSIS — I472 Ventricular tachycardia: Secondary | ICD-10-CM | POA: Diagnosis not present

## 2019-07-16 DIAGNOSIS — J40 Bronchitis, not specified as acute or chronic: Secondary | ICD-10-CM | POA: Diagnosis not present

## 2019-07-16 DIAGNOSIS — K5790 Diverticulosis of intestine, part unspecified, without perforation or abscess without bleeding: Secondary | ICD-10-CM | POA: Diagnosis not present

## 2019-07-16 DIAGNOSIS — E538 Deficiency of other specified B group vitamins: Secondary | ICD-10-CM | POA: Diagnosis not present

## 2019-07-16 DIAGNOSIS — D62 Acute posthemorrhagic anemia: Secondary | ICD-10-CM | POA: Diagnosis not present

## 2019-07-16 DIAGNOSIS — N183 Chronic kidney disease, stage 3 (moderate): Secondary | ICD-10-CM | POA: Diagnosis not present

## 2019-07-16 DIAGNOSIS — F339 Major depressive disorder, recurrent, unspecified: Secondary | ICD-10-CM | POA: Diagnosis not present

## 2019-07-16 DIAGNOSIS — I739 Peripheral vascular disease, unspecified: Secondary | ICD-10-CM | POA: Diagnosis not present

## 2019-07-16 DIAGNOSIS — J189 Pneumonia, unspecified organism: Secondary | ICD-10-CM | POA: Diagnosis not present

## 2019-07-16 DIAGNOSIS — Q2733 Arteriovenous malformation of digestive system vessel: Secondary | ICD-10-CM | POA: Diagnosis not present

## 2019-07-16 DIAGNOSIS — I7090 Unspecified atherosclerosis: Secondary | ICD-10-CM | POA: Diagnosis not present

## 2019-07-16 DIAGNOSIS — K920 Hematemesis: Secondary | ICD-10-CM | POA: Diagnosis not present

## 2019-07-16 DIAGNOSIS — E1122 Type 2 diabetes mellitus with diabetic chronic kidney disease: Secondary | ICD-10-CM | POA: Diagnosis not present

## 2019-07-16 DIAGNOSIS — K31811 Angiodysplasia of stomach and duodenum with bleeding: Secondary | ICD-10-CM | POA: Diagnosis not present

## 2019-07-16 DIAGNOSIS — R42 Dizziness and giddiness: Secondary | ICD-10-CM | POA: Diagnosis not present

## 2019-07-16 DIAGNOSIS — D649 Anemia, unspecified: Secondary | ICD-10-CM | POA: Diagnosis not present

## 2019-07-16 DIAGNOSIS — I5022 Chronic systolic (congestive) heart failure: Secondary | ICD-10-CM | POA: Diagnosis not present

## 2019-07-16 DIAGNOSIS — K648 Other hemorrhoids: Secondary | ICD-10-CM | POA: Diagnosis not present

## 2019-07-16 DIAGNOSIS — I4891 Unspecified atrial fibrillation: Secondary | ICD-10-CM | POA: Diagnosis not present

## 2019-07-16 DIAGNOSIS — K922 Gastrointestinal hemorrhage, unspecified: Secondary | ICD-10-CM | POA: Diagnosis not present

## 2019-07-16 DIAGNOSIS — R079 Chest pain, unspecified: Secondary | ICD-10-CM | POA: Diagnosis not present

## 2019-07-16 DIAGNOSIS — I130 Hypertensive heart and chronic kidney disease with heart failure and stage 1 through stage 4 chronic kidney disease, or unspecified chronic kidney disease: Secondary | ICD-10-CM | POA: Diagnosis not present

## 2019-07-16 DIAGNOSIS — K449 Diaphragmatic hernia without obstruction or gangrene: Secondary | ICD-10-CM | POA: Diagnosis not present

## 2019-07-16 DIAGNOSIS — J90 Pleural effusion, not elsewhere classified: Secondary | ICD-10-CM | POA: Diagnosis not present

## 2019-07-16 DIAGNOSIS — I25118 Atherosclerotic heart disease of native coronary artery with other forms of angina pectoris: Secondary | ICD-10-CM | POA: Diagnosis not present

## 2019-07-16 DIAGNOSIS — E1151 Type 2 diabetes mellitus with diabetic peripheral angiopathy without gangrene: Secondary | ICD-10-CM | POA: Diagnosis not present

## 2019-08-16 DIAGNOSIS — I509 Heart failure, unspecified: Secondary | ICD-10-CM | POA: Diagnosis not present

## 2019-08-16 DIAGNOSIS — Z95810 Presence of automatic (implantable) cardiac defibrillator: Secondary | ICD-10-CM | POA: Diagnosis not present

## 2019-08-26 DIAGNOSIS — M48062 Spinal stenosis, lumbar region with neurogenic claudication: Secondary | ICD-10-CM | POA: Diagnosis not present

## 2019-09-08 DIAGNOSIS — M549 Dorsalgia, unspecified: Secondary | ICD-10-CM | POA: Diagnosis not present

## 2019-09-08 DIAGNOSIS — M48062 Spinal stenosis, lumbar region with neurogenic claudication: Secondary | ICD-10-CM | POA: Diagnosis not present

## 2019-09-08 DIAGNOSIS — M5126 Other intervertebral disc displacement, lumbar region: Secondary | ICD-10-CM | POA: Diagnosis not present

## 2019-09-23 DIAGNOSIS — M50322 Other cervical disc degeneration at C5-C6 level: Secondary | ICD-10-CM | POA: Diagnosis not present

## 2019-09-23 DIAGNOSIS — M79661 Pain in right lower leg: Secondary | ICD-10-CM | POA: Diagnosis not present

## 2019-09-23 DIAGNOSIS — M79662 Pain in left lower leg: Secondary | ICD-10-CM | POA: Diagnosis not present

## 2019-09-23 DIAGNOSIS — M48062 Spinal stenosis, lumbar region with neurogenic claudication: Secondary | ICD-10-CM | POA: Diagnosis not present

## 2019-09-30 DIAGNOSIS — Z23 Encounter for immunization: Secondary | ICD-10-CM | POA: Diagnosis not present

## 2019-10-10 DIAGNOSIS — I5022 Chronic systolic (congestive) heart failure: Secondary | ICD-10-CM | POA: Diagnosis not present

## 2019-10-10 DIAGNOSIS — I110 Hypertensive heart disease with heart failure: Secondary | ICD-10-CM | POA: Diagnosis not present

## 2019-10-10 DIAGNOSIS — I340 Nonrheumatic mitral (valve) insufficiency: Secondary | ICD-10-CM | POA: Diagnosis not present

## 2019-10-10 DIAGNOSIS — I471 Supraventricular tachycardia: Secondary | ICD-10-CM | POA: Diagnosis not present

## 2019-10-10 DIAGNOSIS — I252 Old myocardial infarction: Secondary | ICD-10-CM | POA: Diagnosis not present

## 2019-10-10 DIAGNOSIS — Z951 Presence of aortocoronary bypass graft: Secondary | ICD-10-CM | POA: Diagnosis not present

## 2019-10-10 DIAGNOSIS — I2510 Atherosclerotic heart disease of native coronary artery without angina pectoris: Secondary | ICD-10-CM | POA: Diagnosis not present

## 2019-10-15 DIAGNOSIS — I2510 Atherosclerotic heart disease of native coronary artery without angina pectoris: Secondary | ICD-10-CM | POA: Diagnosis not present

## 2019-10-15 DIAGNOSIS — I70213 Atherosclerosis of native arteries of extremities with intermittent claudication, bilateral legs: Secondary | ICD-10-CM | POA: Diagnosis not present

## 2019-10-15 DIAGNOSIS — N183 Chronic kidney disease, stage 3 unspecified: Secondary | ICD-10-CM | POA: Diagnosis not present

## 2019-10-15 DIAGNOSIS — E1122 Type 2 diabetes mellitus with diabetic chronic kidney disease: Secondary | ICD-10-CM | POA: Diagnosis not present

## 2019-10-15 DIAGNOSIS — F329 Major depressive disorder, single episode, unspecified: Secondary | ICD-10-CM | POA: Diagnosis not present

## 2019-10-15 DIAGNOSIS — I5022 Chronic systolic (congestive) heart failure: Secondary | ICD-10-CM | POA: Diagnosis not present

## 2019-10-15 DIAGNOSIS — J449 Chronic obstructive pulmonary disease, unspecified: Secondary | ICD-10-CM | POA: Diagnosis not present

## 2019-10-15 DIAGNOSIS — I472 Ventricular tachycardia: Secondary | ICD-10-CM | POA: Diagnosis not present

## 2019-10-15 DIAGNOSIS — I252 Old myocardial infarction: Secondary | ICD-10-CM | POA: Diagnosis not present

## 2019-10-15 DIAGNOSIS — E785 Hyperlipidemia, unspecified: Secondary | ICD-10-CM | POA: Diagnosis not present

## 2019-10-15 DIAGNOSIS — E1151 Type 2 diabetes mellitus with diabetic peripheral angiopathy without gangrene: Secondary | ICD-10-CM | POA: Diagnosis not present

## 2019-10-15 DIAGNOSIS — I130 Hypertensive heart and chronic kidney disease with heart failure and stage 1 through stage 4 chronic kidney disease, or unspecified chronic kidney disease: Secondary | ICD-10-CM | POA: Diagnosis not present

## 2019-10-15 DIAGNOSIS — I340 Nonrheumatic mitral (valve) insufficiency: Secondary | ICD-10-CM | POA: Diagnosis not present

## 2019-10-18 DIAGNOSIS — I472 Ventricular tachycardia: Secondary | ICD-10-CM | POA: Diagnosis not present

## 2019-10-18 DIAGNOSIS — E785 Hyperlipidemia, unspecified: Secondary | ICD-10-CM | POA: Diagnosis not present

## 2019-10-18 DIAGNOSIS — I130 Hypertensive heart and chronic kidney disease with heart failure and stage 1 through stage 4 chronic kidney disease, or unspecified chronic kidney disease: Secondary | ICD-10-CM | POA: Diagnosis not present

## 2019-10-18 DIAGNOSIS — I2510 Atherosclerotic heart disease of native coronary artery without angina pectoris: Secondary | ICD-10-CM | POA: Diagnosis not present

## 2019-10-18 DIAGNOSIS — J449 Chronic obstructive pulmonary disease, unspecified: Secondary | ICD-10-CM | POA: Diagnosis not present

## 2019-10-18 DIAGNOSIS — I5022 Chronic systolic (congestive) heart failure: Secondary | ICD-10-CM | POA: Diagnosis not present

## 2019-10-18 DIAGNOSIS — F329 Major depressive disorder, single episode, unspecified: Secondary | ICD-10-CM | POA: Diagnosis not present

## 2019-10-18 DIAGNOSIS — N183 Chronic kidney disease, stage 3 unspecified: Secondary | ICD-10-CM | POA: Diagnosis not present

## 2019-10-18 DIAGNOSIS — E1151 Type 2 diabetes mellitus with diabetic peripheral angiopathy without gangrene: Secondary | ICD-10-CM | POA: Diagnosis not present

## 2019-10-18 DIAGNOSIS — E1122 Type 2 diabetes mellitus with diabetic chronic kidney disease: Secondary | ICD-10-CM | POA: Diagnosis not present

## 2019-10-18 DIAGNOSIS — I340 Nonrheumatic mitral (valve) insufficiency: Secondary | ICD-10-CM | POA: Diagnosis not present

## 2019-10-18 DIAGNOSIS — I70213 Atherosclerosis of native arteries of extremities with intermittent claudication, bilateral legs: Secondary | ICD-10-CM | POA: Diagnosis not present

## 2019-10-18 DIAGNOSIS — I252 Old myocardial infarction: Secondary | ICD-10-CM | POA: Diagnosis not present

## 2019-12-29 DIAGNOSIS — I509 Heart failure, unspecified: Secondary | ICD-10-CM | POA: Diagnosis not present

## 2019-12-29 DIAGNOSIS — Z95810 Presence of automatic (implantable) cardiac defibrillator: Secondary | ICD-10-CM | POA: Diagnosis not present

## 2020-01-24 DIAGNOSIS — R1110 Vomiting, unspecified: Secondary | ICD-10-CM | POA: Diagnosis not present

## 2020-01-24 DIAGNOSIS — R42 Dizziness and giddiness: Secondary | ICD-10-CM | POA: Diagnosis not present

## 2020-01-24 DIAGNOSIS — Z20828 Contact with and (suspected) exposure to other viral communicable diseases: Secondary | ICD-10-CM | POA: Diagnosis not present

## 2020-02-06 DIAGNOSIS — F419 Anxiety disorder, unspecified: Secondary | ICD-10-CM | POA: Diagnosis not present

## 2020-02-06 DIAGNOSIS — K219 Gastro-esophageal reflux disease without esophagitis: Secondary | ICD-10-CM | POA: Diagnosis not present

## 2020-02-06 DIAGNOSIS — I130 Hypertensive heart and chronic kidney disease with heart failure and stage 1 through stage 4 chronic kidney disease, or unspecified chronic kidney disease: Secondary | ICD-10-CM | POA: Diagnosis not present

## 2020-02-06 DIAGNOSIS — I5022 Chronic systolic (congestive) heart failure: Secondary | ICD-10-CM | POA: Diagnosis not present

## 2020-02-06 DIAGNOSIS — R05 Cough: Secondary | ICD-10-CM | POA: Diagnosis not present

## 2020-02-06 DIAGNOSIS — R42 Dizziness and giddiness: Secondary | ICD-10-CM | POA: Diagnosis not present

## 2020-02-06 DIAGNOSIS — R0602 Shortness of breath: Secondary | ICD-10-CM | POA: Diagnosis not present

## 2020-02-06 DIAGNOSIS — G8929 Other chronic pain: Secondary | ICD-10-CM | POA: Diagnosis not present

## 2020-02-06 DIAGNOSIS — I25118 Atherosclerotic heart disease of native coronary artery with other forms of angina pectoris: Secondary | ICD-10-CM | POA: Diagnosis not present

## 2020-02-06 DIAGNOSIS — R519 Headache, unspecified: Secondary | ICD-10-CM | POA: Diagnosis not present

## 2020-02-06 DIAGNOSIS — N1832 Chronic kidney disease, stage 3b: Secondary | ICD-10-CM | POA: Diagnosis not present

## 2020-02-06 DIAGNOSIS — N183 Chronic kidney disease, stage 3 unspecified: Secondary | ICD-10-CM | POA: Diagnosis not present

## 2020-02-06 DIAGNOSIS — E1122 Type 2 diabetes mellitus with diabetic chronic kidney disease: Secondary | ICD-10-CM | POA: Diagnosis not present

## 2020-02-06 DIAGNOSIS — R5383 Other fatigue: Secondary | ICD-10-CM | POA: Diagnosis not present

## 2020-02-07 DIAGNOSIS — I959 Hypotension, unspecified: Secondary | ICD-10-CM | POA: Diagnosis not present

## 2020-02-07 DIAGNOSIS — R17 Unspecified jaundice: Secondary | ICD-10-CM | POA: Diagnosis not present

## 2020-02-07 DIAGNOSIS — I495 Sick sinus syndrome: Secondary | ICD-10-CM | POA: Diagnosis not present

## 2020-02-07 DIAGNOSIS — Z87891 Personal history of nicotine dependence: Secondary | ICD-10-CM | POA: Diagnosis not present

## 2020-02-07 DIAGNOSIS — R109 Unspecified abdominal pain: Secondary | ICD-10-CM | POA: Diagnosis not present

## 2020-02-07 DIAGNOSIS — R6521 Severe sepsis with septic shock: Secondary | ICD-10-CM | POA: Diagnosis not present

## 2020-02-07 DIAGNOSIS — N281 Cyst of kidney, acquired: Secondary | ICD-10-CM | POA: Diagnosis not present

## 2020-02-07 DIAGNOSIS — N200 Calculus of kidney: Secondary | ICD-10-CM | POA: Diagnosis not present

## 2020-02-07 DIAGNOSIS — R0602 Shortness of breath: Secondary | ICD-10-CM | POA: Diagnosis not present

## 2020-02-07 DIAGNOSIS — Z950 Presence of cardiac pacemaker: Secondary | ICD-10-CM | POA: Diagnosis not present

## 2020-02-07 DIAGNOSIS — E872 Acidosis: Secondary | ICD-10-CM | POA: Diagnosis not present

## 2020-02-07 DIAGNOSIS — A419 Sepsis, unspecified organism: Secondary | ICD-10-CM | POA: Diagnosis not present

## 2020-02-07 DIAGNOSIS — Z20822 Contact with and (suspected) exposure to covid-19: Secondary | ICD-10-CM | POA: Diagnosis not present

## 2020-02-07 DIAGNOSIS — I700 Atherosclerosis of aorta: Secondary | ICD-10-CM | POA: Diagnosis not present

## 2020-02-07 DIAGNOSIS — R7989 Other specified abnormal findings of blood chemistry: Secondary | ICD-10-CM | POA: Diagnosis not present

## 2020-02-07 DIAGNOSIS — J90 Pleural effusion, not elsewhere classified: Secondary | ICD-10-CM | POA: Diagnosis not present

## 2020-02-07 DIAGNOSIS — I498 Other specified cardiac arrhythmias: Secondary | ICD-10-CM | POA: Diagnosis not present

## 2020-02-08 MED ORDER — GENERIC EXTERNAL MEDICATION
0.03 | Status: DC
Start: ? — End: 2020-02-08

## 2020-02-08 MED ORDER — GENERIC EXTERNAL MEDICATION
2.25 | Status: DC
Start: 2020-02-08 — End: 2020-02-08

## 2020-02-08 MED ORDER — GENERIC EXTERNAL MEDICATION
0.00 | Status: DC
Start: ? — End: 2020-02-08

## 2020-02-13 DEATH — deceased
